# Patient Record
Sex: Male | Born: 1980
Health system: Southern US, Community
[De-identification: ages and names within clinical notes are randomized; demographics above are authoritative.]

## PROBLEM LIST (undated history)

## (undated) DIAGNOSIS — E785 Hyperlipidemia, unspecified: Secondary | ICD-10-CM

## (undated) DIAGNOSIS — M109 Gout, unspecified: Secondary | ICD-10-CM

## (undated) DIAGNOSIS — I10 Essential (primary) hypertension: Secondary | ICD-10-CM

## (undated) DIAGNOSIS — K219 Gastro-esophageal reflux disease without esophagitis: Secondary | ICD-10-CM

## (undated) DIAGNOSIS — G5622 Lesion of ulnar nerve, left upper limb: Secondary | ICD-10-CM

## (undated) HISTORY — DX: Lesion of ulnar nerve, left upper limb: G56.22

## (undated) HISTORY — DX: Morbid (severe) obesity due to excess calories: E66.01

## (undated) HISTORY — DX: Hyperlipidemia, unspecified: E78.5

---

## 2002-01-17 ENCOUNTER — Emergency Department (HOSPITAL_COMMUNITY): Admission: EM | Admit: 2002-01-17 | Discharge: 2002-01-17 | Payer: Self-pay | Admitting: Emergency Medicine

## 2002-01-17 ENCOUNTER — Encounter: Payer: Self-pay | Admitting: Emergency Medicine

## 2009-07-26 ENCOUNTER — Emergency Department (HOSPITAL_COMMUNITY): Admission: EM | Admit: 2009-07-26 | Discharge: 2009-07-26 | Payer: Self-pay | Admitting: Emergency Medicine

## 2009-07-28 ENCOUNTER — Encounter: Admission: RE | Admit: 2009-07-28 | Discharge: 2009-07-28 | Payer: Self-pay | Admitting: *Deleted

## 2010-11-07 IMAGING — US US ABDOMEN COMPLETE
1 series · 14 of 25 positions shown · non-contrast
Comparison: None

CLINICAL DATA: Epigastric pain and vomiting.

ABDOMINAL ULTRASOUND COMPLETE

[Series 1: us abdomen complete · 0.28mm/px · 14 of 72 slices shown]
[im 1/72]
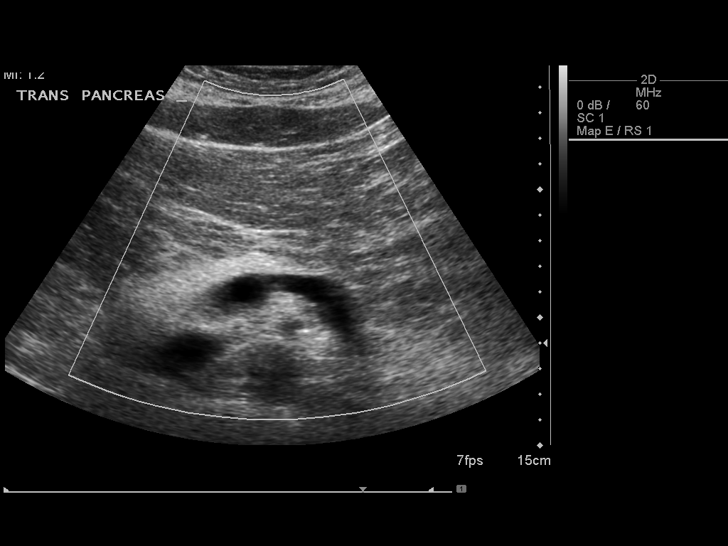
[im 6/72]
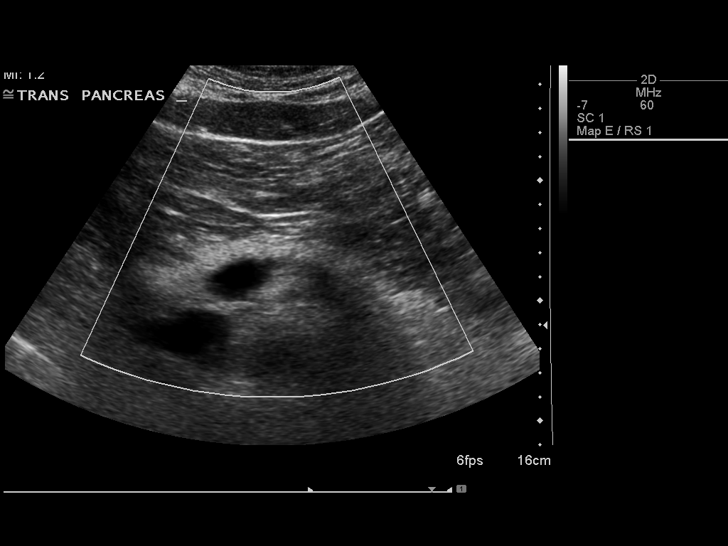
[im 12/72]
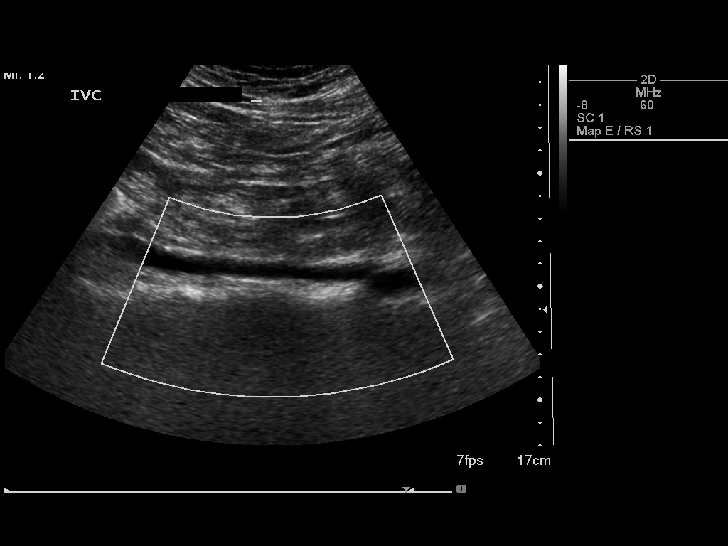
[im 18/72]
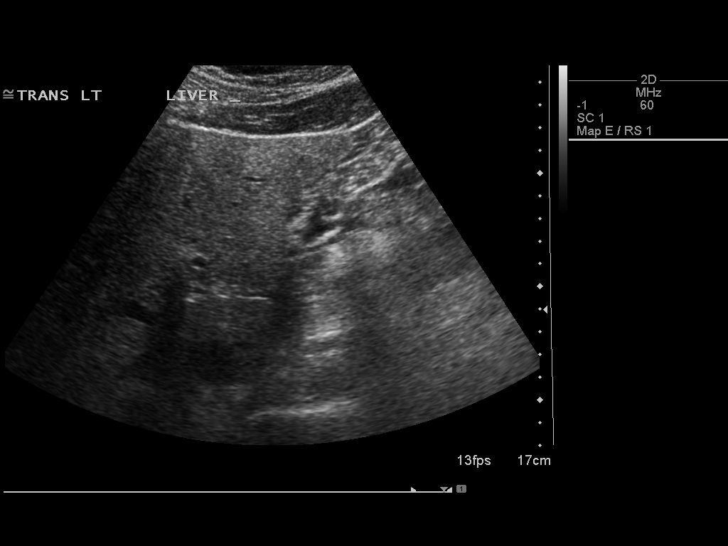
[im 24/72]
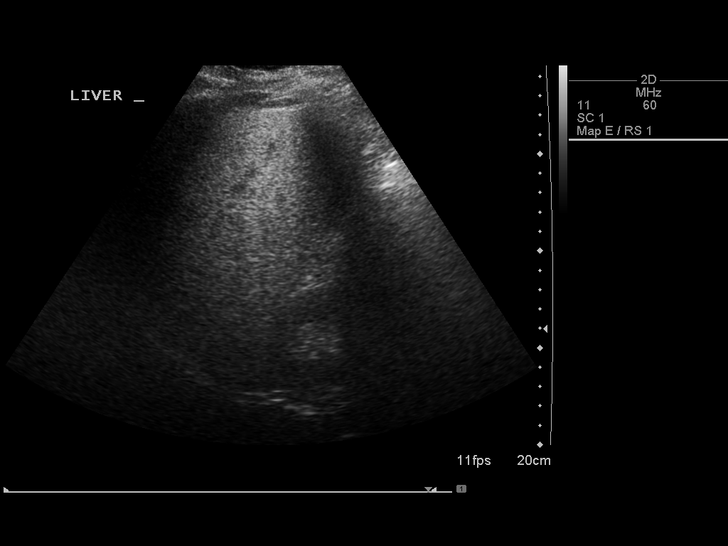
[im 27/72]
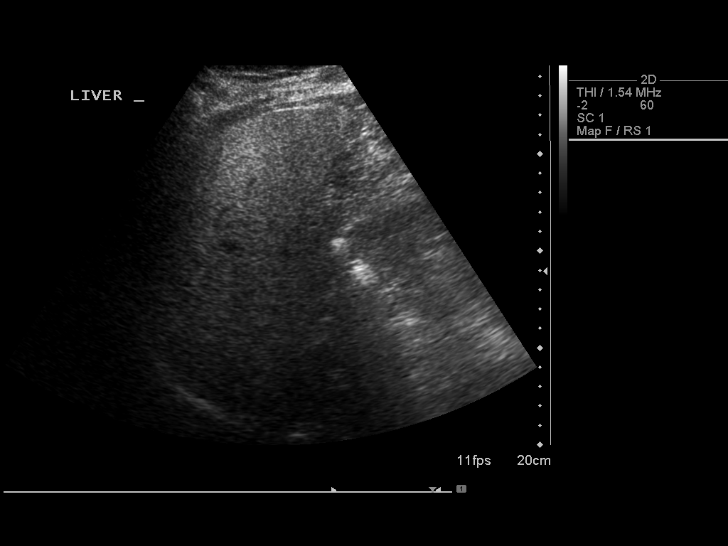
[im 33/72]
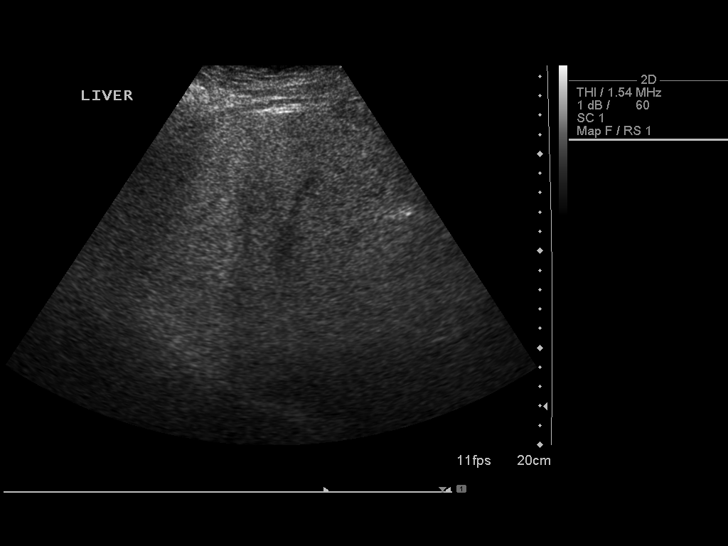
[im 39/72]
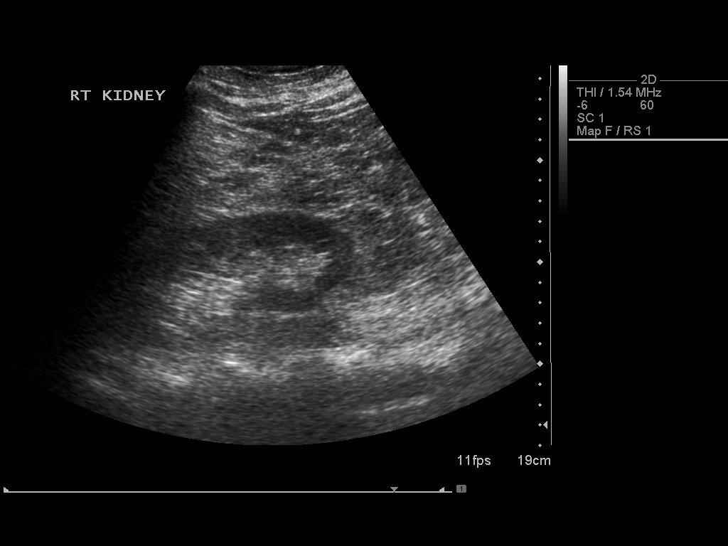
[im 45/72]
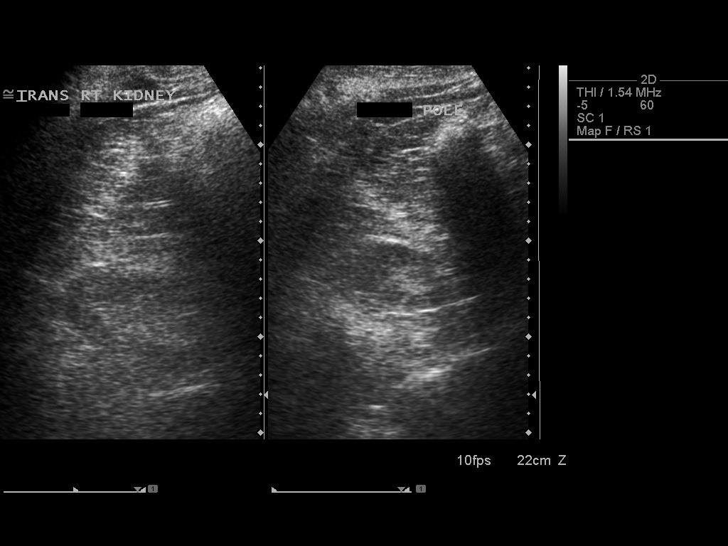
[im 48/72]
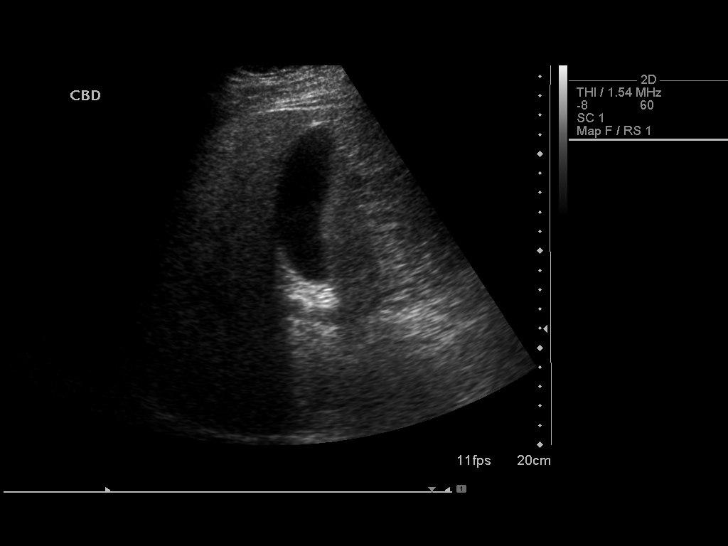
[im 54/72]
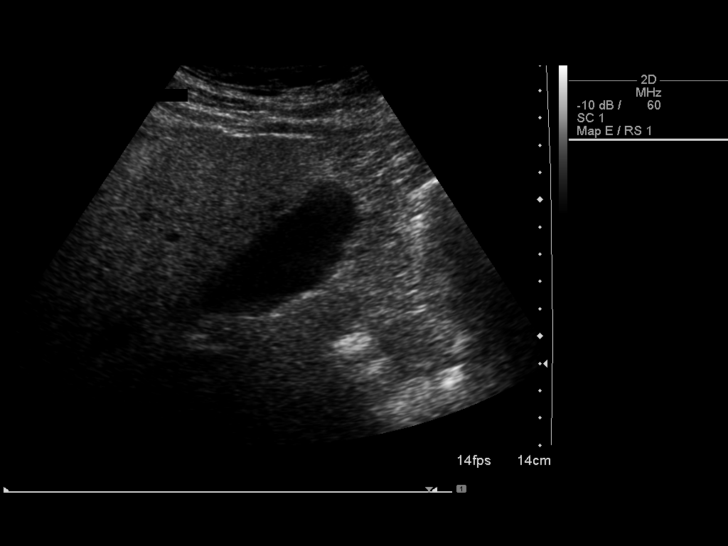
[im 60/72]
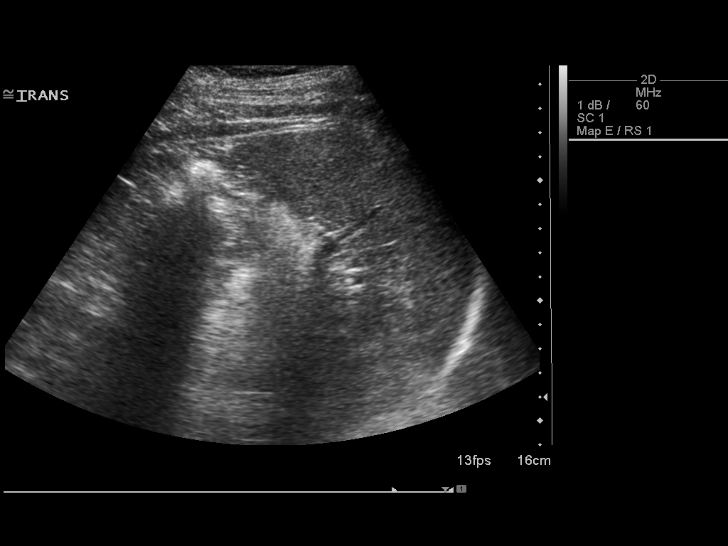
[im 66/72]
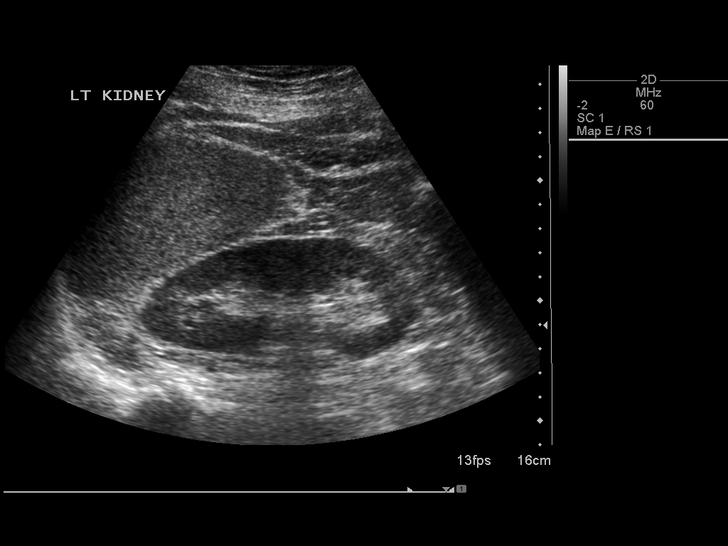
[im 72/72]
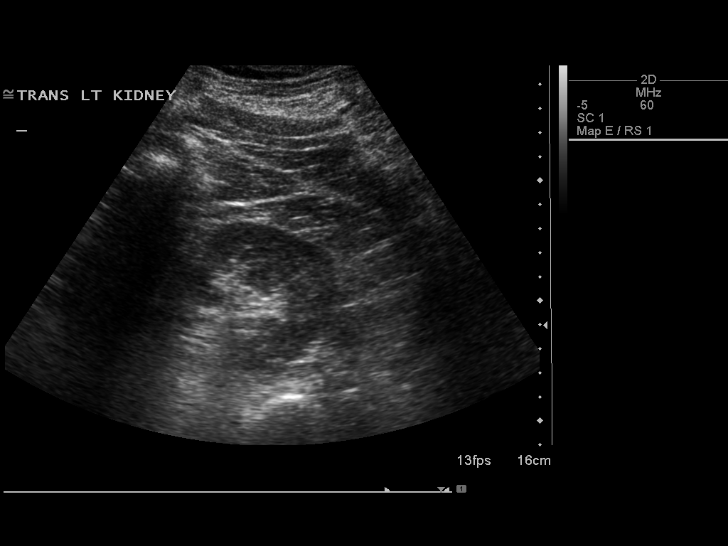

[14 of 25 positions shown; findings below may reference images not displayed]

FINDINGS: Gallbladder:  No gallstones, gallbladder wall thickening, or
pericholecystic fluid.

Common Bile Duct:  Within normal limits in caliber.

Liver:  No focal lesion identified.  Diffusely increased
parenchymal echogenicity, consistent with hepatic steatosis.

IVC:  Appears normal.

Pancreas:  Although the pancreas is difficult to visualize in its
entirety, no focal pancreatic abnormality is identified.

Spleen:  Within normal limits in size and echotexture.

Right kidney:  Normal in size and parenchymal echogenicity.  No
evidence of mass or hydronephrosis.

Left kidney:  Normal in size and parenchymal echogenicity.  No
evidence of mass or hydronephrosis.

Abdominal Aorta:  No aneurysm identified.
IMPRESSION: 1.  No evidence of gallstones or biliary dilatation.
2.  Hepatic steatosis.

## 2017-02-22 ENCOUNTER — Emergency Department (HOSPITAL_COMMUNITY): Payer: BLUE CROSS/BLUE SHIELD

## 2017-02-22 ENCOUNTER — Encounter (HOSPITAL_COMMUNITY): Payer: Self-pay

## 2017-02-22 DIAGNOSIS — R079 Chest pain, unspecified: Secondary | ICD-10-CM | POA: Diagnosis present

## 2017-02-22 DIAGNOSIS — R51 Headache: Secondary | ICD-10-CM | POA: Diagnosis not present

## 2017-02-22 DIAGNOSIS — Z87891 Personal history of nicotine dependence: Secondary | ICD-10-CM | POA: Insufficient documentation

## 2017-02-22 DIAGNOSIS — Z79899 Other long term (current) drug therapy: Secondary | ICD-10-CM | POA: Diagnosis not present

## 2017-02-22 DIAGNOSIS — I10 Essential (primary) hypertension: Secondary | ICD-10-CM | POA: Diagnosis not present

## 2017-02-22 DIAGNOSIS — R0789 Other chest pain: Secondary | ICD-10-CM | POA: Insufficient documentation

## 2017-02-22 LAB — BASIC METABOLIC PANEL
ANION GAP: 9 (ref 5–15)
BUN: 11 mg/dL (ref 6–20)
CALCIUM: 9.3 mg/dL (ref 8.9–10.3)
CO2: 25 mmol/L (ref 22–32)
Chloride: 103 mmol/L (ref 101–111)
Creatinine, Ser: 1.04 mg/dL (ref 0.61–1.24)
GFR calc Af Amer: >60 mL/min (ref >60)
GFR calc non Af Amer: >60 mL/min (ref >60)
GLUCOSE: 93 mg/dL (ref 65–99)
Potassium: 3.6 mmol/L (ref 3.5–5.1)
Sodium: 137 mmol/L (ref 135–145)

## 2017-02-22 LAB — CBC
HCT: 45.1 % (ref 39.0–52.0)
HEMOGLOBIN: 15.7 g/dL (ref 13.0–17.0)
MCH: 29.4 pg (ref 26.0–34.0)
MCHC: 34.8 g/dL (ref 30.0–36.0)
MCV: 84.5 fL (ref 78.0–100.0)
Platelets: 203 10*3/uL (ref 150–400)
RBC: 5.34 MIL/uL (ref 4.22–5.81)
RDW: 13 % (ref 11.5–15.5)
WBC: 8.7 10*3/uL (ref 4.0–10.5)

## 2017-02-22 LAB — I-STAT TROPONIN, ED: TROPONIN I, POC: 0 ng/mL (ref 0.00–0.08)

## 2017-02-22 NOTE — ED Triage Notes (Signed)
Pt complaining of central chest pressure x 2 days. Pt states started on lisinopril yesterday. Pt denies any SOB or cough. Pt a/o x 4, VSS, NAD.

## 2017-02-23 ENCOUNTER — Emergency Department (HOSPITAL_COMMUNITY)
Admission: EM | Admit: 2017-02-23 | Discharge: 2017-02-23 | Disposition: A | Discharge status: Home / Self Care | Payer: BLUE CROSS/BLUE SHIELD | Attending: Emergency Medicine | Admitting: Emergency Medicine

## 2017-02-23 ENCOUNTER — Emergency Department (HOSPITAL_COMMUNITY): Payer: BLUE CROSS/BLUE SHIELD

## 2017-02-23 DIAGNOSIS — R0789 Other chest pain: Secondary | ICD-10-CM

## 2017-02-23 DIAGNOSIS — R519 Headache, unspecified: Secondary | ICD-10-CM

## 2017-02-23 DIAGNOSIS — R51 Headache: Secondary | ICD-10-CM

## 2017-02-23 HISTORY — DX: Essential (primary) hypertension: I10

## 2017-02-23 LAB — I-STAT TROPONIN, ED: Troponin i, poc: 0 ng/mL (ref 0.00–0.08)

## 2017-02-23 NOTE — ED Provider Notes (Signed)
[facility]-EMERGENCY DEPT Provider Note   CSN: [id number] Arrival date & time: 02/22/17  2138 By signing my name below, I, Drue Flirt, attest that this documentation has been prepared under the direction and in the presence of Tomasita Crumble, MD . Electronically Signed: Drue Flirt Scribe. 02/23/2017. 2:28 AM  History   Chief Complaint Chief Complaint  Patient presents with  . Chest Pain   HPI Anthony Liu is a 36 y.o. male with a history of hypertension who presents to the Emergency Department complaining of sudden onset, intermittent, pressure-like chest pain onset two nights ago. Pt reports associated sudden-onset, waxing and waning, throbbing headache x2 days and intermittent sleep disturbance. No exacerbating or alleviating factors noted. Per pt, his symptoms began while having intercourse with his wife two nights ago. He was seen by his PCP yesterday for the same and dx with an orgasm related migraine. His headache and CP significantly improved yesterday, but he notes that he was unable to sleep last night. Pt reports that his chest pain returned tonight at about 8 pm while trying to fall asleep. He denies any pulmonary or cardiac hx. He denies any headache, chest tightness, diaphoresis, or n/v.   The history is provided by the patient. No language interpreter was used.   Past Medical History:  Diagnosis Date  . Hypertension    There are no active problems to display for this patient.  History reviewed. No pertinent surgical history.  Home Medications    Prior to Admission medications   Not on File    Family History History reviewed. No pertinent family history.  Social History Social History  Substance Use Topics  . Smoking status: Former Smoker    Quit date: 02/23/2015  . Smokeless tobacco: Current User    Types: Snuff  . Alcohol use Yes   Allergies   Patient has no allergy information on record.  Review of Systems Review of Systems 10 Systems reviewed and are  negative for acute change except as noted in the HPI.  Physical Exam Updated Vital Signs BP 139/85   Pulse 67   Temp 98.1 F (36.7 C) (Oral)   Resp 15   SpO2 98%   Physical Exam  Constitutional: He is oriented to person, place, and time. Vital signs are normal. He appears well-developed and well-nourished.  Non-toxic appearance. He does not appear ill. No distress.  HENT:  Head: Normocephalic and atraumatic.  Nose: Nose normal.  Mouth/Throat: Oropharynx is clear and moist. No oropharyngeal exudate.  Eyes: Conjunctivae and EOM are normal. Pupils are equal, round, and reactive to light. No scleral icterus.  Neck: Normal range of motion. Neck supple. No tracheal deviation, no edema, no erythema and normal range of motion present. No thyroid mass and no thyromegaly present.  Cardiovascular: Normal rate, regular rhythm, S1 normal, S2 normal, normal heart sounds, intact distal pulses and normal pulses.  Exam reveals no gallop and no friction rub.   No murmur heard. Pulmonary/Chest: Effort normal and breath sounds normal. No respiratory distress. He has no wheezes. He has no rhonchi. He has no rales.  Abdominal: Soft. Normal appearance and bowel sounds are normal. He exhibits no distension, no ascites and no mass. There is no hepatosplenomegaly. There is no tenderness. There is no rebound, no guarding and no CVA tenderness.  Musculoskeletal: Normal range of motion. He exhibits no edema or tenderness.  Lymphadenopathy:    He has no cervical adenopathy.  Neurological: He is alert and oriented to person, place, and time.  He has normal strength. No cranial nerve deficit or sensory deficit.  Skin: Skin is warm, dry and intact. No petechiae and no rash noted. He is not diaphoretic. No erythema. No pallor.  Nursing note and vitals reviewed.  ED Treatments / Results    DIAGNOSTIC STUDIES:  Oxygen Saturation is 98% on RA, normal by my interpretation.    COORDINATION OF CARE:  1:43 AM Will order  CT head. Discussed treatment plan with pt at bedside and pt agreed to plan.     Labs (all labs ordered are listed, but only abnormal results are displayed) Labs Reviewed  BASIC METABOLIC PANEL  CBC  I-STAT TROPOININ, ED  I-STAT TROPOININ, ED    EKG  EKG Interpretation  Date/Time:  Saturday February 23 2017 02:03:05 EDT Ventricular Rate:  64 PR Interval:  146 QRS Duration: 109 QT Interval:  417 QTC Calculation: 431 R Axis:   50 Text Interpretation:  Sinus rhythm No significant change since last tracing Confirmed by Erroll Luna [phone]) on 02/23/2017 2:06:02 AM      Radiology Dg Chest 2 View  Result Date: 02/22/2017 CLINICAL DATA:  Initial evaluation for acute chest pressure for 2 days. EXAM: CHEST  2 VIEW COMPARISON:  None available. FINDINGS: The cardiac and mediastinal silhouettes are within normal limits. The lungs are normally inflated. No airspace consolidation, pleural effusion, or pulmonary edema is identified. There is no pneumothorax. No acute osseous abnormality identified. IMPRESSION: No active cardiopulmonary disease. Electronically Signed   By: Rise Mu M.D.   On: 02/22/2017 22:29   Ct Head Wo Contrast  Result Date: 02/23/2017 CLINICAL DATA:  Headache, chest pressure. Started lisinopril yesterday. EXAM: CT HEAD WITHOUT CONTRAST TECHNIQUE: Contiguous axial images were obtained from the base of the skull through the vertex without intravenous contrast. COMPARISON:  None. FINDINGS: BRAIN: No intraparenchymal hemorrhage, mass effect nor midline shift. The ventricles and sulci are normal. No acute large vascular territory infarcts. No abnormal extra-axial fluid collections. Basal cisterns are patent. VASCULAR: Unremarkable. SKULL/SOFT TISSUES: No skull fracture. No significant soft tissue swelling. ORBITS/SINUSES: The included ocular globes and orbital contents are normal.The mastoid aircells and included paranasal sinuses are well-aerated. OTHER: None.  IMPRESSION: Normal noncontrast CT HEAD. Electronically Signed   By: Awilda Metro M.D.   On: 02/23/2017 03:04    Procedures Procedures (including critical care time)  Medications Ordered in ED Medications - No data to display  Initial Impression / Assessment and Plan / ED Course  I have reviewed the triage vital signs and the nursing notes.  Pertinent labs & imaging results that were available during my care of the patient were reviewed by me and considered in my medical decision making (see chart for details).    Patient presents to the ED for headache during intercourse and intermittent chest pain.  History is not typical for ACS, HEART score is 1 for risk factors.  Repeat troponin and EKG at 3 hours are negative and unchanged. CT head normal as well.  Advised on tylenol or ibuprofen as needed for pain and PCP fu within 3 days. HE appears well and in NAD.  He demonstrates god understanding to the plan.  VS remain within his normal limits and he is safe for DC.  Final Clinical Impressions(s) / ED Diagnoses   Final diagnoses:  None    New Prescriptions New Prescriptions   No medications on file   I personally performed the services described in this documentation, which was scribed in my presence. The  recorded information has been reviewed and is accurate.       Tomasita Crumble, MD 02/23/17 [phone]

## 2017-02-23 NOTE — ED Notes (Signed)
Patient transported to CT 

## 2018-04-10 ENCOUNTER — Other Ambulatory Visit: Payer: Self-pay

## 2018-04-10 ENCOUNTER — Emergency Department (HOSPITAL_COMMUNITY): Payer: BLUE CROSS/BLUE SHIELD

## 2018-04-10 ENCOUNTER — Emergency Department (HOSPITAL_COMMUNITY)
Admission: EM | Admit: 2018-04-10 | Discharge: 2018-04-10 | Disposition: A | Discharge status: Home / Self Care | Payer: BLUE CROSS/BLUE SHIELD | Attending: Emergency Medicine | Admitting: Emergency Medicine

## 2018-04-10 ENCOUNTER — Encounter (HOSPITAL_COMMUNITY): Payer: Self-pay

## 2018-04-10 DIAGNOSIS — I249 Acute ischemic heart disease, unspecified: Secondary | ICD-10-CM | POA: Diagnosis not present

## 2018-04-10 DIAGNOSIS — Z87891 Personal history of nicotine dependence: Secondary | ICD-10-CM | POA: Insufficient documentation

## 2018-04-10 DIAGNOSIS — I10 Essential (primary) hypertension: Secondary | ICD-10-CM | POA: Diagnosis not present

## 2018-04-10 DIAGNOSIS — G5622 Lesion of ulnar nerve, left upper limb: Secondary | ICD-10-CM | POA: Insufficient documentation

## 2018-04-10 DIAGNOSIS — R079 Chest pain, unspecified: Secondary | ICD-10-CM | POA: Diagnosis not present

## 2018-04-10 DIAGNOSIS — R05 Cough: Secondary | ICD-10-CM | POA: Diagnosis not present

## 2018-04-10 HISTORY — DX: Gastro-esophageal reflux disease without esophagitis: K21.9

## 2018-04-10 HISTORY — DX: Gout, unspecified: M10.9

## 2018-04-10 LAB — BASIC METABOLIC PANEL
Anion gap: 9 (ref 5–15)
BUN: 12 mg/dL (ref 6–20)
CALCIUM: 9.2 mg/dL (ref 8.9–10.3)
CO2: 27 mmol/L (ref 22–32)
Chloride: 103 mmol/L (ref 101–111)
Creatinine, Ser: 0.99 mg/dL (ref 0.61–1.24)
GFR calc Af Amer: >60 mL/min (ref >60)
GLUCOSE: 119 mg/dL — AB (ref 65–99)
POTASSIUM: 4.2 mmol/L (ref 3.5–5.1)
Sodium: 139 mmol/L (ref 135–145)

## 2018-04-10 LAB — I-STAT TROPONIN, ED
Troponin i, poc: 0 ng/mL (ref 0.00–0.08)
Troponin i, poc: 0.01 ng/mL (ref 0.00–0.08)

## 2018-04-10 LAB — CBC
HEMATOCRIT: 46.3 % (ref 39.0–52.0)
HEMOGLOBIN: 15.4 g/dL (ref 13.0–17.0)
MCH: 28.5 pg (ref 26.0–34.0)
MCHC: 33.3 g/dL (ref 30.0–36.0)
MCV: 85.7 fL (ref 78.0–100.0)
Platelets: 223 10*3/uL (ref 150–400)
RBC: 5.4 MIL/uL (ref 4.22–5.81)
RDW: 12.6 % (ref 11.5–15.5)
WBC: 8.3 10*3/uL (ref 4.0–10.5)

## 2018-04-10 MED ORDER — MELOXICAM 15 MG PO TABS: 15.0000 mg | ORAL_TABLET | Freq: Every day | ORAL | 0 refills | Status: AC

## 2018-04-10 NOTE — ED Notes (Signed)
ED Provider at bedside. 

## 2018-04-10 NOTE — ED Provider Notes (Signed)
[facility] Provider Note   CSN: [id number] Arrival date & time: 04/10/18  [id number]     History   Chief Complaint Chief Complaint  Patient presents with  . Chest Pain  Chief complaint: Left arm from elbow down to fingertips began hurting yesterday, followed by chest pain that began around 11:30 PM last night.  HPI Anthony Liu is a 37 y.o. male with a history of uncontrolled hypertension and GERD presenting primarily for chest pain that began at 11 PM last night.  Patient describes chest pain as a constant pressure in the center of his chest radiating to his back between his shoulder blades. Patient took to 200 mg ibuprofen last night for the pain however the pain has remained unchanged.  Patient states pain remains at 2/10.  Patient states pain is not worsened with movement or exertion.  Patient was seen here last year in April for similar chest pain however he is concerned today because he did not have the arm tingling last year when he was seen.  Associated symptoms: Left arm pain and tingling that started just above his left elbow and radiates down to his left pinky and ring finger.  Patient states that pain began yesterday afternoon while driving home from work then states pain changed last night to a tingling feeling around the same time that his chest pain began.  Patient denies weakness in the extremity. Patient also states he has had 3 bouts of watery diarrhea in the last 24 hours, last bowel movement this morning.  Denies blood in stool, abdominal pain, fever, nausea or vomiting.   Past Medical History:  Diagnosis Date  . GERD (gastroesophageal reflux disease)   . Gout   . Hypertension     There are no active problems to display for this patient.   History reviewed. No pertinent surgical history.      Home Medications    Prior to Admission medications   Medication Sig Start Date End Date Taking? Authorizing Provider  meloxicam  (MOBIC) 15 MG tablet Take 1 tablet (15 mg total) by mouth daily for 7 days. 04/10/18 04/17/18  Bill SalinasMorelli, Grayton Lobo A, PA-C    Family History No family history on file.  Social History Social History   Tobacco Use  . Smoking status: Former Smoker    Last attempt to quit: 02/23/2015    Years since quitting: 3.1  . Smokeless tobacco: Current User    Types: Snuff  Substance Use Topics  . Alcohol use: Yes  . Drug use: Never     Allergies   Patient has no known allergies.   Review of Systems Review of Systems  Constitutional: Negative.  Negative for chills, diaphoresis and fever.  HENT: Positive for congestion and sore throat. Negative for ear pain, hearing loss, rhinorrhea, trouble swallowing and voice change.   Eyes: Negative.   Respiratory: Positive for cough. Negative for shortness of breath, wheezing and stridor.   Cardiovascular: Positive for chest pain. Negative for leg swelling.  Gastrointestinal: Positive for diarrhea. Negative for abdominal pain, blood in stool, constipation, nausea and vomiting.  Endocrine: Negative.   Genitourinary: Negative.  Negative for dysuria, hematuria and testicular pain.  Musculoskeletal: Positive for arthralgias. Negative for back pain and myalgias.  Skin: Negative.  Negative for rash and wound.  Allergic/Immunologic: Positive for environmental allergies.  Neurological: Positive for numbness. Negative for dizziness, syncope, speech difficulty, weakness, light-headedness and headaches.  Hematological: Negative.   Psychiatric/Behavioral: Negative.  Physical Exam Updated Vital Signs BP 125/82   Pulse 63   Temp 98.1 F (36.7 C) (Oral)   Resp 18   Ht 5\' 10"  (1.778 m)   Wt 117.9 kg (260 lb)   SpO2 96%   BMI 37.31 kg/m   Physical Exam  Constitutional: He is oriented to person, place, and time. He appears well-developed and well-nourished.  Non-toxic appearance. He does not appear ill. No distress.  HENT:  Head: Normocephalic and  atraumatic.  Eyes: EOM are normal.  Neck: Normal range of motion.  Cardiovascular: Normal rate, regular rhythm, intact distal pulses and normal pulses. Exam reveals no gallop and no distant heart sounds.  No murmur heard.  No systolic murmur is present.  No diastolic murmur is present. Chest pain is worsened with palpation of the sternum.  Pulmonary/Chest: Effort normal and breath sounds normal. No accessory muscle usage or stridor. No tachypnea. No respiratory distress. He has no decreased breath sounds. He has no wheezes. He has no rhonchi. He has no rales. He exhibits tenderness. He exhibits no mass, no laceration, no crepitus, no edema, no deformity, no swelling and no retraction.    Abdominal: Soft. Bowel sounds are normal. He exhibits no distension and no mass. There is no tenderness. There is no rebound and no guarding.  Musculoskeletal: Normal range of motion.       Left shoulder: Normal. He exhibits normal range of motion and no deformity.       Left elbow: Normal. He exhibits normal range of motion, no swelling, no effusion, no deformity and no laceration. No tenderness found.       Left wrist: Normal. He exhibits normal range of motion, no tenderness, no bony tenderness, no swelling, no effusion, no crepitus, no deformity and no laceration.       Right forearm: Normal. He exhibits no tenderness, no bony tenderness, no swelling, no edema, no deformity and no laceration.       Right hand: Normal. He exhibits normal range of motion, no tenderness, no bony tenderness, normal capillary refill, no deformity and no swelling. Normal sensation noted. Normal strength noted.       Right lower leg: Normal.       Left lower leg: Normal.  Neurological: He is alert and oriented to person, place, and time.  Skin: Skin is warm and dry. Capillary refill takes less than 2 seconds. No abrasion, no ecchymosis and no rash noted.  Psychiatric: He has a normal mood and affect. His behavior is normal.      ED Treatments / Results  Labs (all labs ordered are listed, but only abnormal results are displayed) Labs Reviewed  BASIC METABOLIC PANEL - Abnormal; Notable for the following components:      Result Value   Glucose, Bld 119 (*)    All other components within normal limits  CBC  I-STAT TROPONIN, ED  I-STAT TROPONIN, ED    EKG EKG Interpretation  Date/Time:  Thursday April 10 2018 05:15:44 EDT Ventricular Rate:  76 PR Interval:  144 QRS Duration: 94 QT Interval:  402 QTC Calculation: 452 R Axis:   42 Text Interpretation:  Normal sinus rhythm Normal ECG Confirmed by Zadie Rhine ([id number]) on 04/10/2018 6:26:14 AM Also confirmed by Zadie Rhine ([id number]), editor Elita Quick [phone])  on 04/10/2018 7:05:33 AM   Radiology Dg Chest 2 View  Result Date: 04/10/2018 CLINICAL DATA:  Chest pain.  Left arm tingling. EXAM: CHEST - 2 VIEW COMPARISON:  02/22/2017 FINDINGS: The cardiomediastinal  contours are normal. The lungs are clear. Pulmonary vasculature is normal. No consolidation, pleural effusion, or pneumothorax. No acute osseous abnormalities are seen. IMPRESSION: No acute pulmonary process. Electronically Signed   By: Rubye Oaks M.D.   On: 04/10/2018 05:48    Procedures Procedures (including critical care time)  Medications Ordered in ED Medications - No data to display   Initial Impression / Assessment and Plan / ED Course  I have reviewed the triage vital signs and the nursing notes.  Pertinent labs & imaging results that were available during my care of the patient were reviewed by me and considered in my medical decision making (see chart for details).    Patient is to be discharged with recommendation to follow up with PCP in regards to today's hospital visit. Chest pain is not likely of cardiac or pulmonary etiology d/t presentation, perc negative, VSS, no tracheal deviation, no JVD or new murmur, RRR, breath sounds equal bilaterally, EKG without  acute abnormalities, negative troponin x2, and negative CXR.  Pain is also reproducible by pushing on the sternum.  Pt has been advised to return to the ED is CP becomes exertional, associated with diaphoresis or nausea, radiates to left jaw/arm, worsens or becomes concerning in any way. Pt appears reliable for follow up and is agreeable to discharge.  Heart score of 2.  Patient encouraged to follow-up with primary care provider, given referral to community health and wellness and encouraged to follow-up.  Left arm pain and tingling likely unrelated to chest pain, tingling and pain is reproducible by pressing on the medial condyle of the left arm.  Likely ulnar neuropathy/cubital tunnel syndrome due to the patient's work.  Patient will be given follow-up with hand specialist.  Patient will be given prescription for NSAIDs for pain.  Patient given extensive return precautions, patient states understanding of return precautions.  Patient given return precautions in AVS.  Case has been discussed with and seen by Arthor Captain PA-C who agrees with the above plan to discharge.    Final Clinical Impressions(s) / ED Diagnoses   Final diagnoses:  Chest pain with low risk of acute coronary syndrome  Ulnar neuropathy at elbow of left upper extremity    ED Discharge Orders        Ordered    meloxicam (MOBIC) 15 MG tablet  Daily     04/10/18 0953       Bill Salinas, PA-C 04/10/18 1055    Tilden Fossa, MD 04/10/18 [phone]

## 2018-04-10 NOTE — Discharge Instructions (Addendum)
Get help right away if: °Your chest pain is worse. °You have a cough that gets worse, or you cough up blood. °You have severe pain in your abdomen. °You have severe weakness. °You faint. °You have sudden, unexplained chest discomfort. °You have sudden, unexplained discomfort in your arms, back, neck, or jaw. °You have shortness of breath at any time. °You suddenly start to sweat, or your skin gets clammy. °You feel nauseous or you vomit. °You suddenly feel light-headed or dizzy. °Your heart begins to beat quickly, or it feels like it is skipping beats. °

## 2018-04-10 NOTE — ED Triage Notes (Signed)
Pt states that last night he was having pain and tingling in his L arm, that resolved and came back, around midnight central CP started , radiates to back, denies SOB, n/v.

## 2018-06-04 IMAGING — DX DG CHEST 2V
2 series · 2 of 2 positions shown · non-contrast
Comparison: None available.

CLINICAL DATA: Initial evaluation for acute chest pressure for 2
days.

EXAM:
CHEST  2 VIEW

[chest pa]
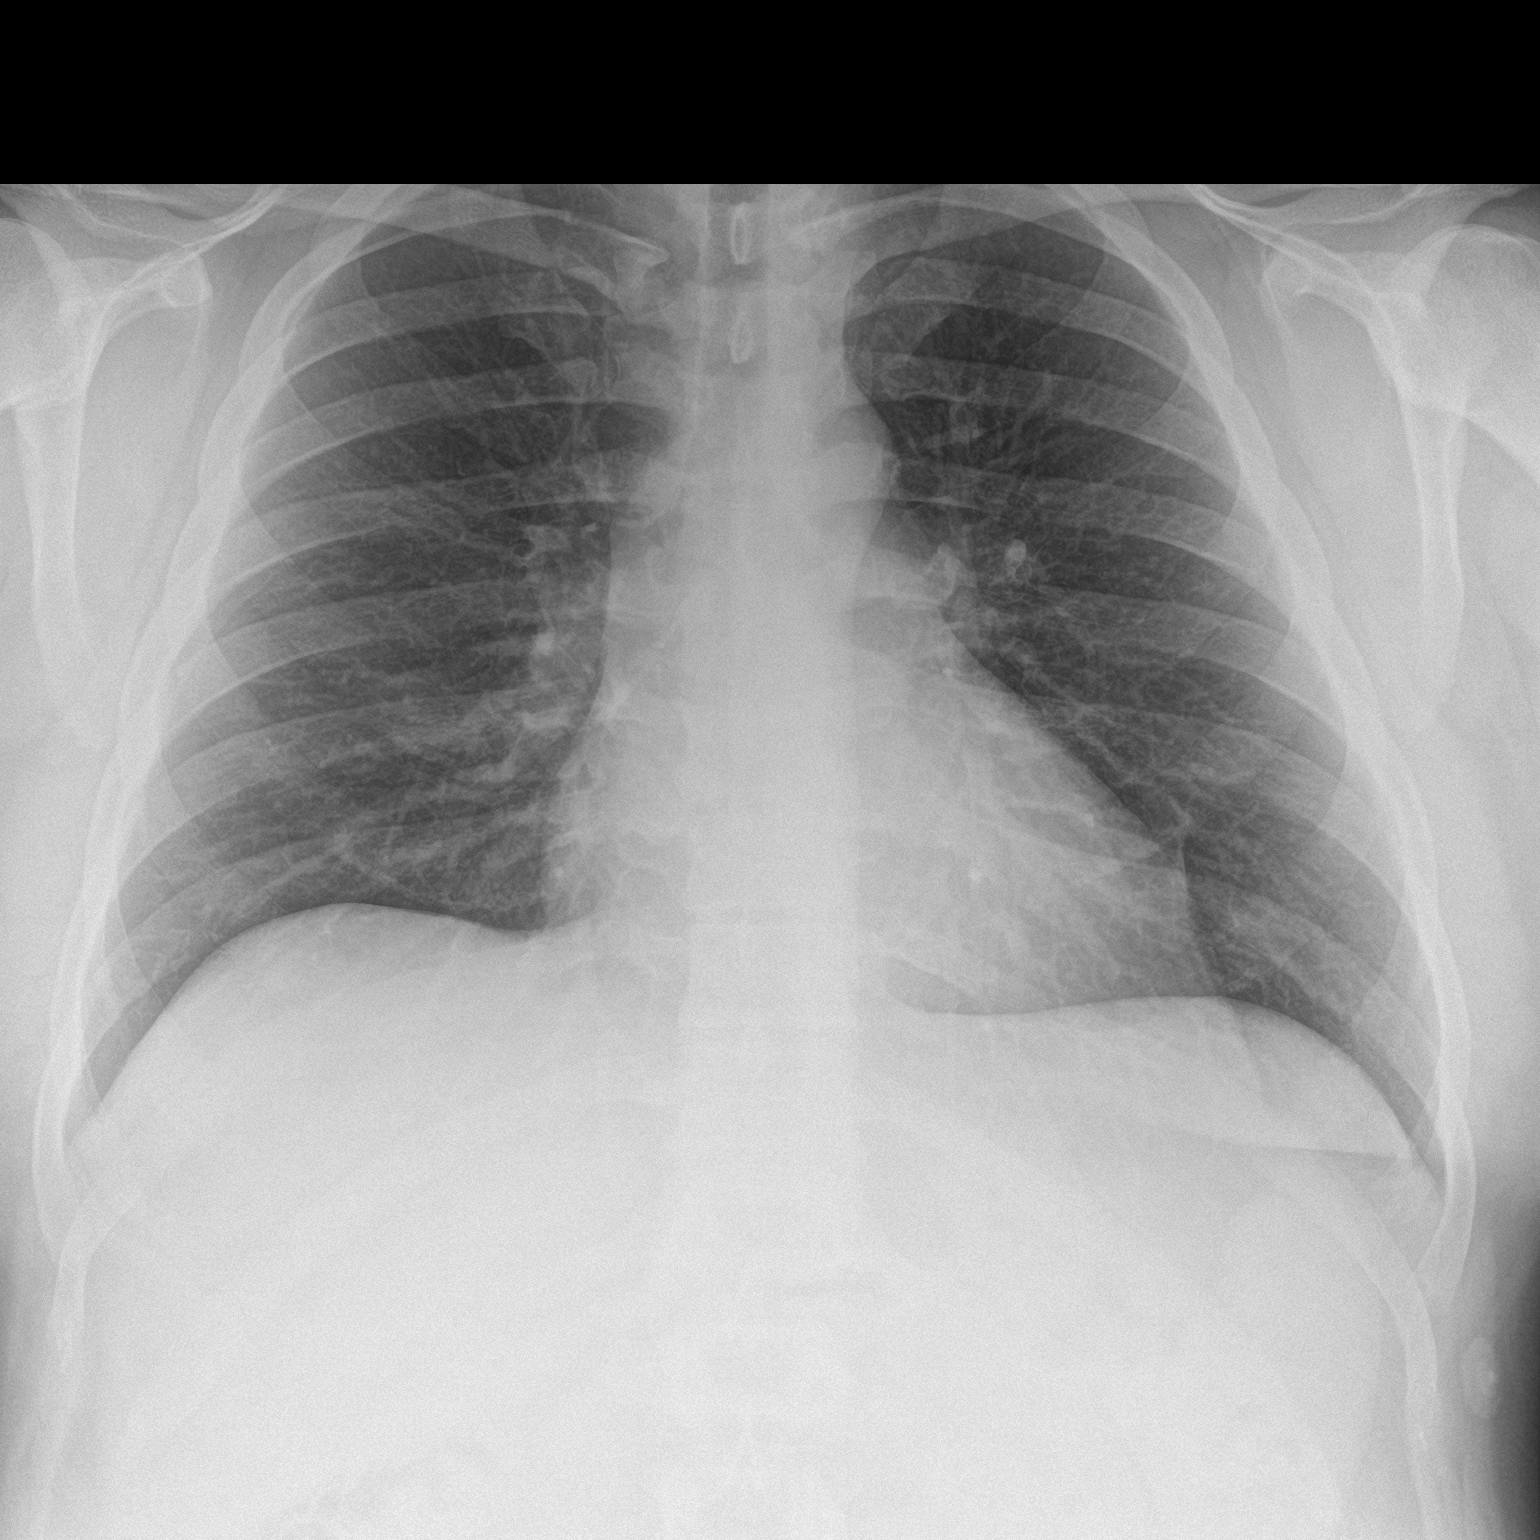

[chest lat]
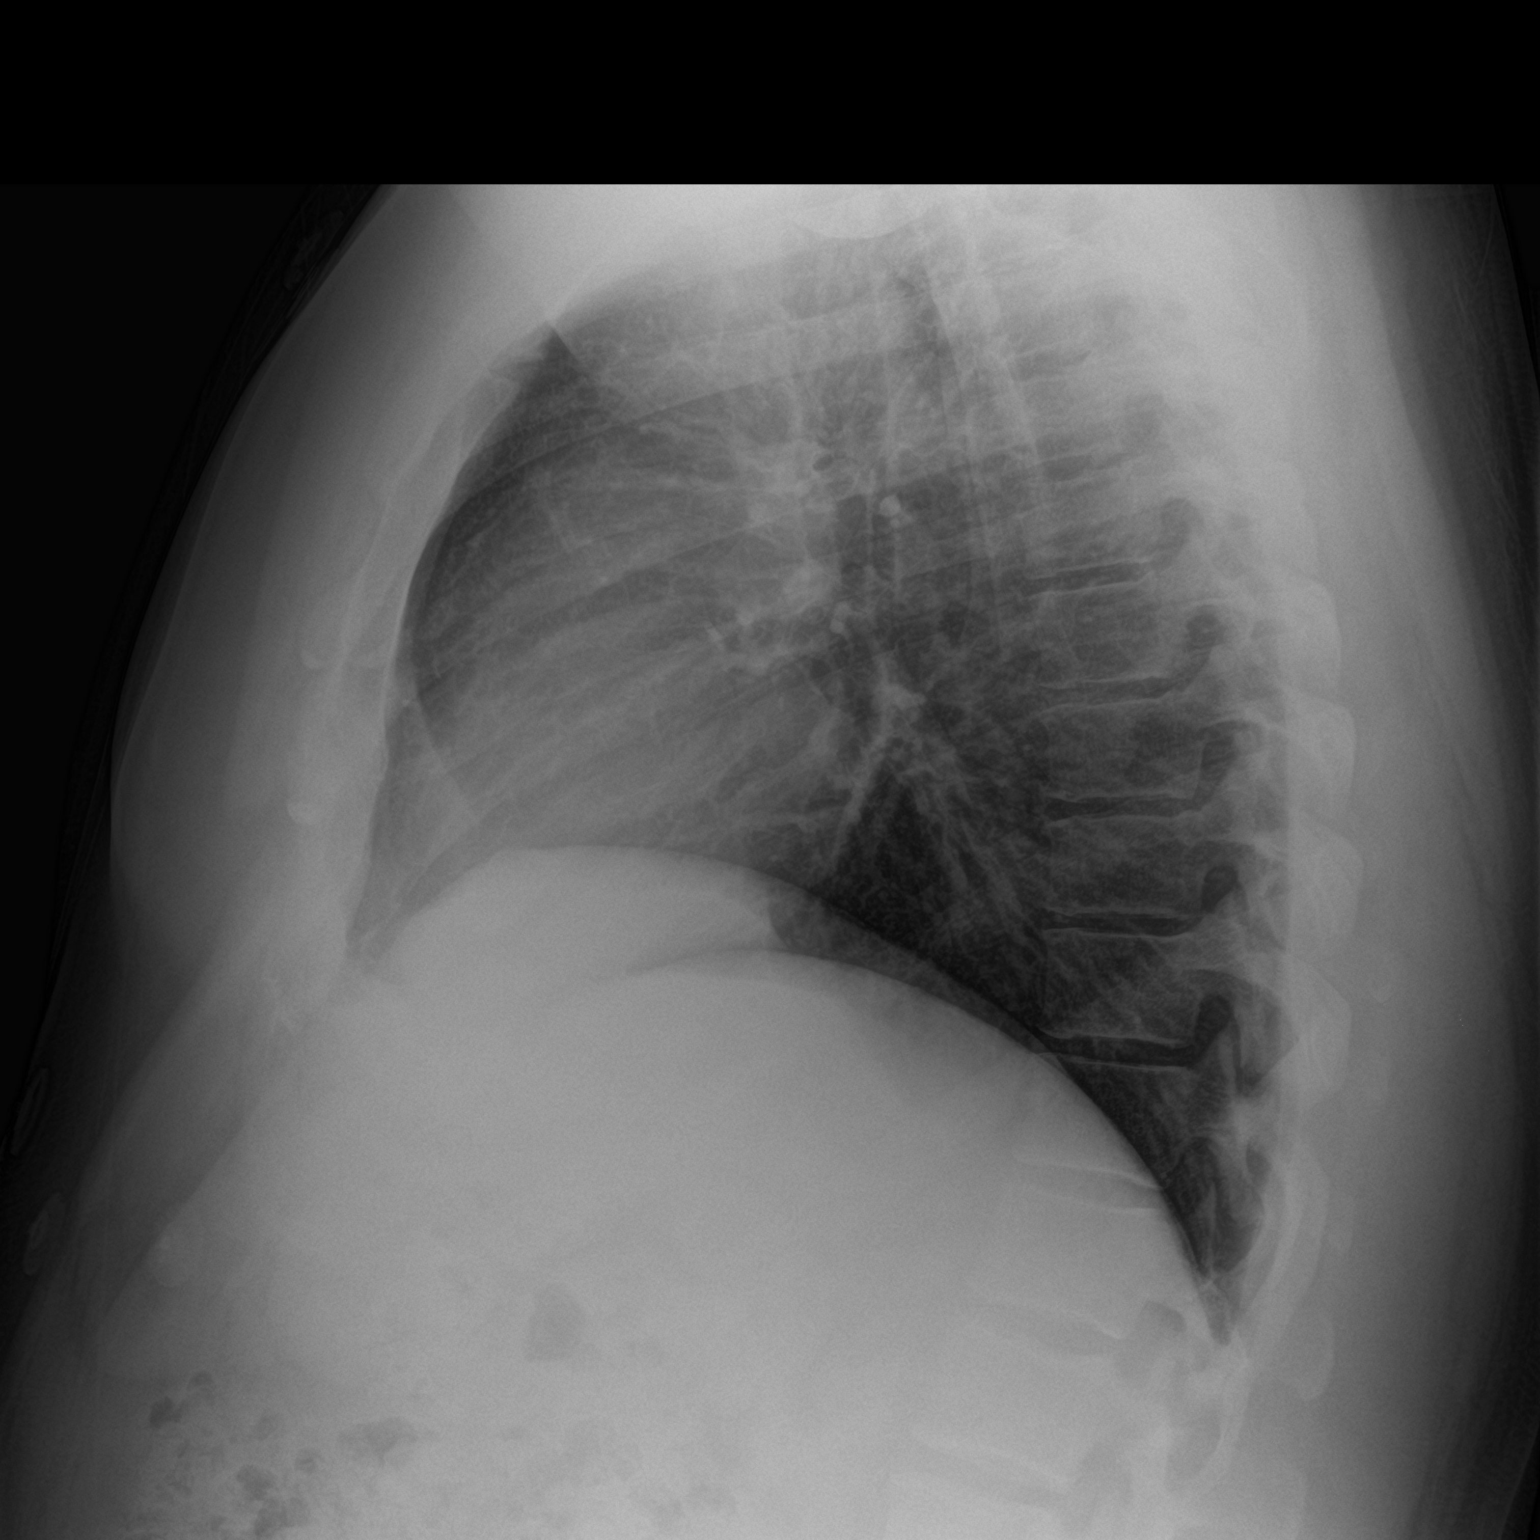

[2 of 2 positions shown; findings below may reference images not displayed]

FINDINGS: The cardiac and mediastinal silhouettes are within normal limits.

The lungs are normally inflated. No airspace consolidation, pleural
effusion, or pulmonary edema is identified. There is no
pneumothorax.

No acute osseous abnormality identified.
IMPRESSION: No active cardiopulmonary disease.

## 2018-12-08 ENCOUNTER — Encounter (HOSPITAL_COMMUNITY): Payer: Self-pay | Admitting: Emergency Medicine

## 2018-12-08 ENCOUNTER — Other Ambulatory Visit: Payer: Self-pay

## 2018-12-08 ENCOUNTER — Emergency Department (HOSPITAL_COMMUNITY)
Admission: EM | Admit: 2018-12-08 | Discharge: 2018-12-09 | Disposition: A | Discharge status: Home / Self Care | Payer: BLUE CROSS/BLUE SHIELD | Attending: Emergency Medicine | Admitting: Emergency Medicine

## 2018-12-08 ENCOUNTER — Emergency Department (HOSPITAL_COMMUNITY): Payer: BLUE CROSS/BLUE SHIELD

## 2018-12-08 DIAGNOSIS — R0789 Other chest pain: Secondary | ICD-10-CM | POA: Insufficient documentation

## 2018-12-08 DIAGNOSIS — Z87891 Personal history of nicotine dependence: Secondary | ICD-10-CM | POA: Insufficient documentation

## 2018-12-08 DIAGNOSIS — R079 Chest pain, unspecified: Secondary | ICD-10-CM | POA: Diagnosis not present

## 2018-12-08 DIAGNOSIS — I10 Essential (primary) hypertension: Secondary | ICD-10-CM | POA: Diagnosis not present

## 2018-12-08 LAB — I-STAT TROPONIN, ED: TROPONIN I, POC: 0 ng/mL (ref 0.00–0.08)

## 2018-12-08 MED ORDER — SODIUM CHLORIDE 0.9% FLUSH: 3.0000 mL | Freq: Once | INTRAVENOUS | Status: DC

## 2018-12-08 NOTE — ED Triage Notes (Signed)
Pt c/o central chest pain that radiates between the shoulder blades x 1 day. Hx HTN, has been out of lisinopril.

## 2018-12-09 LAB — BASIC METABOLIC PANEL
Anion gap: 9 (ref 5–15)
BUN: 13 mg/dL (ref 6–20)
CHLORIDE: 106 mmol/L (ref 98–111)
CO2: 24 mmol/L (ref 22–32)
Calcium: 8.8 mg/dL — ABNORMAL LOW (ref 8.9–10.3)
Creatinine, Ser: 0.89 mg/dL (ref 0.61–1.24)
GFR calc Af Amer: >60 mL/min (ref >60)
GFR calc non Af Amer: >60 mL/min (ref >60)
Glucose, Bld: 103 mg/dL — ABNORMAL HIGH (ref 70–99)
Potassium: 3.8 mmol/L (ref 3.5–5.1)
Sodium: 139 mmol/L (ref 135–145)

## 2018-12-09 LAB — CBC
HCT: 46.3 % (ref 39.0–52.0)
Hemoglobin: 15.3 g/dL (ref 13.0–17.0)
MCH: 28.2 pg (ref 26.0–34.0)
MCHC: 33 g/dL (ref 30.0–36.0)
MCV: 85.3 fL (ref 80.0–100.0)
NRBC: 0 % (ref 0.0–0.2)
Platelets: 206 10*3/uL (ref 150–400)
RBC: 5.43 MIL/uL (ref 4.22–5.81)
RDW: 13.2 % (ref 11.5–15.5)
WBC: 8 10*3/uL (ref 4.0–10.5)

## 2018-12-09 NOTE — ED Notes (Signed)
Pt returned from outside

## 2018-12-09 NOTE — ED Provider Notes (Signed)
TIME SEEN: 5:52 AM  CHIEF COMPLAINT: Chest pain  HPI: Patient is a 38 year old male with history of obesity, hypertension no longer on medications who presents to the emergency department with chest pain.  States he has had a right-sided dull chest pain for the past 2 to 3 weeks that goes in between his shoulder blades.  No aggravating or alleviating factors.  States pain "comes and goes as it wants".  States he had the pain at night and checked his blood pressure and it was in the 140s/100s and this concerned him so they came to the ED.  Chest pain-free currently.  No shortness of breath, nausea, vomiting, diaphoresis, dizziness.  No history of PE, DVT, exogenous estrogen use, recent fractures, surgery, trauma, hospitalization or prolonged travel. No lower extremity swelling or pain. No calf tenderness.  Does have family history of CAD.  He has never had a stress test or cardiac catheterization.  ROS: See HPI Constitutional: no fever  Eyes: no drainage  ENT: no runny nose   Cardiovascular:   chest pain  Resp: no SOB  GI: no vomiting GU: no dysuria Integumentary: no rash  Allergy: no hives  Musculoskeletal: no leg swelling  Neurological: no slurred speech ROS otherwise negative  PAST MEDICAL HISTORY/PAST SURGICAL HISTORY:  Past Medical History:  Diagnosis Date  . GERD (gastroesophageal reflux disease)   . Gout   . Hypertension     MEDICATIONS:  Prior to Admission medications   Not on File    ALLERGIES:  No Known Allergies  SOCIAL HISTORY:  Social History   Tobacco Use  . Smoking status: Former Smoker    Last attempt to quit: 02/23/2015    Years since quitting: 3.7  . Smokeless tobacco: Current User    Types: Snuff  Substance Use Topics  . Alcohol use: Yes    FAMILY HISTORY: No family history on file.  EXAM: BP (!) 145/93 (BP Location: Right Arm)   Pulse 69   Temp 97.9 F (36.6 C) (Oral)   Resp 17   SpO2 98%  CONSTITUTIONAL: Alert and oriented and responds  appropriately to questions. Well-appearing; well-nourished HEAD: Normocephalic EYES: Conjunctivae clear, pupils appear equal, EOMI ENT: normal nose; moist mucous membranes NECK: Supple, no meningismus, no nuchal rigidity, no LAD  CARD: RRR; S1 and S2 appreciated; no murmurs, no clicks, no rubs, no gallops RESP: Normal chest excursion without splinting or tachypnea; breath sounds clear and equal bilaterally; no wheezes, no rhonchi, no rales, no hypoxia or respiratory distress, speaking full sentences ABD/GI: Normal bowel sounds; non-distended; soft, non-tender, no rebound, no guarding, no peritoneal signs, no hepatosplenomegaly BACK:  The back appears normal and is non-tender to palpation, there is no CVA tenderness EXT: Normal ROM in all joints; non-tender to palpation; no edema; normal capillary refill; no cyanosis, no calf tenderness or swelling    SKIN: Normal color for age and race; warm; no rash NEURO: Moves all extremities equally PSYCH: The patient's mood and manner are appropriate. Grooming and personal hygiene are appropriate.  MEDICAL DECISION MAKING: Patient here with atypical right-sided chest pain.  Now chest pain-free.  Work-up in triage is unremarkable.  Doubt ACS, PE or dissection.  He is PERC negative.  EKG shows no ischemic changes.  Initially was hypertensive here in the 140s/100s but this has improved.  Have advised him to follow-up with his PCP with [facility] closely.  We discussed return precautions.  Patient verbalized understanding.  At this time, I do not feel there is any  life-threatening condition present. I have reviewed and discussed all results (EKG, imaging, lab, urine as appropriate) and exam findings with patient/family. I have reviewed nursing notes and appropriate previous records.  I feel the patient is safe to be discharged home without further emergent workup and can continue workup as an outpatient as needed. Discussed usual and customary return precautions.  Patient/family verbalize understanding and are comfortable with this plan.  Outpatient follow-up has been provided as needed. All questions have been answered.      EKG Interpretation  Date/Time:  Monday December 08 2018 23:19:04 EST Ventricular Rate:  80 PR Interval:  144 QRS Duration: 98 QT Interval:  376 QTC Calculation: 433 R Axis:   45 Text Interpretation:  Normal sinus rhythm Normal ECG When compared with ECG of 04/10/2018, No significant change was found Confirmed by Dione Booze ([id number]) on 12/09/2018 2:24:12 AM         , Layla Maw, DO 12/09/18 0630

## 2018-12-09 NOTE — ED Notes (Signed)
Pt asked tech how many pt's were waiting ahead of him. Walked outside.

## 2018-12-28 ENCOUNTER — Emergency Department (HOSPITAL_COMMUNITY)
Admission: EM | Admit: 2018-12-28 | Discharge: 2018-12-28 | Disposition: A | Discharge status: Home / Self Care | Payer: BLUE CROSS/BLUE SHIELD | Attending: Emergency Medicine | Admitting: Emergency Medicine

## 2018-12-28 ENCOUNTER — Encounter (HOSPITAL_COMMUNITY): Payer: Self-pay | Admitting: Emergency Medicine

## 2018-12-28 DIAGNOSIS — I10 Essential (primary) hypertension: Secondary | ICD-10-CM | POA: Diagnosis not present

## 2018-12-28 DIAGNOSIS — R0789 Other chest pain: Secondary | ICD-10-CM

## 2018-12-28 DIAGNOSIS — E785 Hyperlipidemia, unspecified: Secondary | ICD-10-CM | POA: Diagnosis not present

## 2018-12-28 DIAGNOSIS — R5383 Other fatigue: Secondary | ICD-10-CM | POA: Diagnosis not present

## 2018-12-28 DIAGNOSIS — R51 Headache: Secondary | ICD-10-CM | POA: Diagnosis not present

## 2018-12-28 DIAGNOSIS — R079 Chest pain, unspecified: Secondary | ICD-10-CM | POA: Diagnosis present

## 2018-12-28 DIAGNOSIS — Z87891 Personal history of nicotine dependence: Secondary | ICD-10-CM | POA: Insufficient documentation

## 2018-12-28 LAB — COMPREHENSIVE METABOLIC PANEL
ALT: 40 U/L (ref 0–44)
AST: 28 U/L (ref 15–41)
Albumin: 4.1 g/dL (ref 3.5–5.0)
Alkaline Phosphatase: 63 U/L (ref 38–126)
Anion gap: 6 (ref 5–15)
BUN: 12 mg/dL (ref 6–20)
CO2: 27 mmol/L (ref 22–32)
Calcium: 9.2 mg/dL (ref 8.9–10.3)
Chloride: 104 mmol/L (ref 98–111)
Creatinine, Ser: 0.94 mg/dL (ref 0.61–1.24)
GFR calc non Af Amer: >60 mL/min (ref >60)
Glucose, Bld: 89 mg/dL (ref 70–99)
Potassium: 4.1 mmol/L (ref 3.5–5.1)
Sodium: 137 mmol/L (ref 135–145)
Total Bilirubin: 0.7 mg/dL (ref 0.3–1.2)
Total Protein: 7.1 g/dL (ref 6.5–8.1)

## 2018-12-28 LAB — CBC WITH DIFFERENTIAL/PLATELET
Abs Immature Granulocytes: 0.06 10*3/uL (ref 0.00–0.07)
Basophils Absolute: 0.1 10*3/uL (ref 0.0–0.1)
Basophils Relative: 1 %
Eosinophils Absolute: 0.1 10*3/uL (ref 0.0–0.5)
Eosinophils Relative: 1 %
HCT: 45.8 % (ref 39.0–52.0)
HEMOGLOBIN: 15.6 g/dL (ref 13.0–17.0)
Immature Granulocytes: 1 %
LYMPHS ABS: 2.7 10*3/uL (ref 0.7–4.0)
Lymphocytes Relative: 34 %
MCH: 29 pg (ref 26.0–34.0)
MCHC: 34.1 g/dL (ref 30.0–36.0)
MCV: 85.1 fL (ref 80.0–100.0)
Monocytes Absolute: 0.7 10*3/uL (ref 0.1–1.0)
Monocytes Relative: 9 %
NRBC: 0 % (ref 0.0–0.2)
Neutro Abs: 4.3 10*3/uL (ref 1.7–7.7)
Neutrophils Relative %: 54 %
Platelets: 208 10*3/uL (ref 150–400)
RBC: 5.38 MIL/uL (ref 4.22–5.81)
RDW: 13.1 % (ref 11.5–15.5)
WBC: 7.9 10*3/uL (ref 4.0–10.5)

## 2018-12-28 LAB — I-STAT TROPONIN, ED
TROPONIN I, POC: 0.05 ng/mL (ref 0.00–0.08)
Troponin i, poc: 0 ng/mL (ref 0.00–0.08)

## 2018-12-28 LAB — LIPASE, BLOOD: Lipase: 31 U/L (ref 11–51)

## 2018-12-28 MED ORDER — ALUM & MAG HYDROXIDE-SIMETH 200-200-20 MG/5ML PO SUSP
30.0000 mL | Freq: Once | ORAL | Status: AC
  Administered 2018-12-28: 30 mL via ORAL
  Filled 2018-12-28: qty 30

## 2018-12-28 MED ORDER — LIDOCAINE VISCOUS HCL 2 % MT SOLN
15.0000 mL | Freq: Once | OROMUCOSAL | Status: AC
  Administered 2018-12-28: 15 mL via ORAL
  Filled 2018-12-28: qty 15

## 2018-12-28 MED ORDER — FAMOTIDINE 20 MG PO TABS
20.0000 mg | ORAL_TABLET | Freq: Once | ORAL | Status: AC
  Administered 2018-12-28: 20 mg via ORAL
  Filled 2018-12-28: qty 1

## 2018-12-28 MED ORDER — SUCRALFATE 1 GM/10ML PO SUSP: 1.0000 g | Freq: Three times a day (TID) | ORAL | 0 refills | Status: DC

## 2018-12-28 MED ORDER — LISINOPRIL 10 MG PO TABS
10.0000 mg | ORAL_TABLET | Freq: Once | ORAL | Status: AC
  Administered 2018-12-28: 10 mg via ORAL
  Filled 2018-12-28: qty 1

## 2018-12-28 NOTE — ED Triage Notes (Addendum)
Pt here with c/o central CP.  Pt states pain has been intermittent for about a month that radiates to his left arm and shoulder.   Today he became nauseated and felt like he could not take a deep breath.

## 2018-12-28 NOTE — Discharge Instructions (Addendum)
You were seen in the ER for chest pain. Work up today was reassuring.   I suspect your symptoms may be from gastritis, acid reflux or possibly ulcer.  All of these are managed similarly.   We will treat this with anti-acid medicines.  Start taking over the counter omeprazole to 40 mg daily, take on an empty stomach first thing in the morning and wait 20-30 min before eating.  Add famotidine 20 mg in the AM and PM.  Use sucralfate solution 20 min before meals and at bed time.   Avoid irritating foods and liquids such as alcohol, greasy/fatty or acidic foods. Avoid ibuprofen containing products.   Follow up with primary care doctor for further management of this if it persits.   Return to the ER for fever, chills, blood in vomit or stool, worsening localized abdominal pain to right upper or right lower abdomen, inability to tolerate fluids.    Although this is likely GI related you have some cardiac risk factors.  Recommend discussing this with primary care doctor.  Return if there is any exertional chest pain or shortness of breath, palpitations, passing out or near passing out episodes.

## 2018-12-28 NOTE — ED Provider Notes (Signed)
[facility] EMERGENCY DEPARTMENT Provider Note   CSN: [id number] Arrival date & time: 12/28/18  1856    History   Chief Complaint No chief complaint on file.   HPI Anthony Liu is a 38 y.o. male w/ h/o HTN, elevated BMI, HLD, former tobacco use here for evaluation of substernal, constant, chest pressure for the last 2 weeks.  Non pleuritic, non exertional, non positional.  Initially it was intermittent but now constant for several days.  Feels like his acid reflux but worse and now constant associated with nausea, decreased appetite.  Feels like it is better after he burps.  Currently 1/10.  He has tennis elbow on left and this has been flaring up, causing radiating pain from elbow to shoulder.  He had a headache Friday but resolved.  Has been having some facial flushing, congestion, more tired.  Was seen in the ER 2 weeks ago for this and discharged with PCP f/u.  His work up at that time was benign. Grandmother died of MI at age 3.   No vomiting, SOB, cough, diaphoresis, leg pain or swelling.  Has been out of HTN medicine x 6 months.  H/o GERD takes zantac as needed but not recently. No abd surgeries. H/o ETOH or NSAID use. No h/o DVT/PE.  No abd surgeries     HPI  Past Medical History:  Diagnosis Date  . GERD (gastroesophageal reflux disease)   . Gout   . Hypertension     There are no active problems to display for this patient.   History reviewed. No pertinent surgical history.      Home Medications    Prior to Admission medications   Medication Sig Start Date End Date Taking? Authorizing Provider  Ascorbic Acid (VITAMIN C PO) Take 1 tablet by mouth 2 (two) times daily as needed (when cold-like symptoms present).   Yes [provider]  aspirin EC 325 MG tablet Take 325 mg by mouth as needed (for sudden onset of chest pain).   Yes [provider]  indomethacin (INDOCIN) 50 MG capsule Take 50 mg by mouth 3 (three) times daily as needed  (for gout flares).  07/31/18  Yes [provider]  ranitidine (ZANTAC) 150 MG tablet Take 150 mg by mouth daily as needed for heartburn (or reflux).   Yes [provider]  sucralfate (CARAFATE) 1 GM/10ML suspension Take 10 mLs (1 g total) by mouth 4 (four) times daily -  with meals and at bedtime. 12/28/18   Liberty Handy, PA-C    Family History History reviewed. No pertinent family history.  Social History Social History   Tobacco Use  . Smoking status: Former Smoker    Last attempt to quit: 02/23/2015    Years since quitting: 3.8  . Smokeless tobacco: Current User    Types: Snuff  Substance Use Topics  . Alcohol use: Yes  . Drug use: Never     Allergies   Patient has no known allergies.   Review of Systems Review of Systems  Constitutional: Positive for fatigue and fever.  HENT: Positive for congestion.   Gastrointestinal: Positive for abdominal pain and nausea.  Musculoskeletal: Positive for arthralgias.  Neurological: Positive for headaches.  All other systems reviewed and are negative.    Physical Exam Updated Vital Signs BP 134/77 (BP Location: Right Arm)   Pulse 61   Temp 97.8 F (36.6 C) (Oral)   Resp 18   SpO2 98%   Physical Exam Constitutional:  Appearance: He is well-developed.     Comments: NAD. Non toxic.   HENT:     Head: Normocephalic and atraumatic.     Nose: Nose normal.  Eyes:     General: Lids are normal.     Conjunctiva/sclera: Conjunctivae normal.  Neck:     Musculoskeletal: Normal range of motion.     Trachea: Trachea normal.     Comments: Trachea midline.  Cardiovascular:     Rate and Rhythm: Normal rate and regular rhythm.     Pulses:          Carotid pulses are 2+ on the right side and 2+ on the left side.      Radial pulses are 2+ on the right side and 2+ on the left side.       Dorsalis pedis pulses are 2+ on the right side and 2+ on the left side.     Heart sounds: Normal heart sounds, S1 normal and  S2 normal.     Comments: No LE edema or calf tenderness.  Pulmonary:     Effort: Pulmonary effort is normal.     Breath sounds: Normal breath sounds.  Abdominal:     General: Bowel sounds are normal.     Palpations: Abdomen is soft.     Tenderness: There is no abdominal tenderness.     Comments: No epigastric tenderness. No distention.   Skin:    General: Skin is warm and dry.     Capillary Refill: Capillary refill takes less than 2 seconds.     Comments: No rash to chest wall  Neurological:     Mental Status: He is alert.     GCS: GCS eye subscore is 4. GCS verbal subscore is 5. GCS motor subscore is 6.  Psychiatric:        Speech: Speech normal.        Behavior: Behavior normal.        Thought Content: Thought content normal.      ED Treatments / Results  Labs (all labs ordered are listed, but only abnormal results are displayed) Labs Reviewed  CBC WITH DIFFERENTIAL/PLATELET  COMPREHENSIVE METABOLIC PANEL  LIPASE, BLOOD  I-STAT TROPONIN, ED  I-STAT TROPONIN, ED    EKG EKG Interpretation  Date/Time:  Sunday December 28 2018 19:04:30 EST Ventricular Rate:  64 PR Interval:    QRS Duration: 107 QT Interval:  398 QTC Calculation: 411 R Axis:   49 Text Interpretation:  Sinus rhythm since last tracing no significant change Confirmed by Rolan BuccoBelfi, Melanie [phone]([id number]) on 12/28/2018 8:09:04 PM   Radiology No results found.  Procedures Procedures (including critical care time)  Medications Ordered in ED Medications  alum & mag hydroxide-simeth (MAALOX/MYLANTA) 200-200-20 MG/5ML suspension 30 mL (30 mLs Oral Given 12/28/18 2000)  alum & mag hydroxide-simeth (MAALOX/MYLANTA) 200-200-20 MG/5ML suspension 30 mL (30 mLs Oral Given 12/28/18 2000)    And  lidocaine (XYLOCAINE) 2 % viscous mouth solution 15 mL (15 mLs Oral Given 12/28/18 2000)  famotidine (PEPCID) tablet 20 mg (20 mg Oral Given 12/28/18 2000)  lisinopril (PRINIVIL,ZESTRIL) tablet 10 mg (10 mg Oral Given 12/28/18 2132)      Initial Impression / Assessment and Plan / ED Course  I have reviewed the triage vital signs and the nursing notes.  Pertinent labs & imaging results that were available during my care of the patient were reviewed by me and considered in my medical decision making (see chart for details).  38 year old presents with what sounds like atypical chest pain.  Symptoms sound GI related.  Possibly undertreated GERD, PUD, gastritis, biliary colic. He has no RUQ tenderness and unlikely to be cholecystitis.  Chest pain is nonexertional, nonpleuritic.  States it feels like his acid reflux but it has never been constant like this.  Describes vague left elbow pain but states that he has tennis elbow that feels similar.  This is unlikely to be related.  No other concerning features of chest pain such as exertional shortness of breath, palpitations, lightheadedness, fever, cough, shortness of breath or symptoms to suggest blood clot.  His heart score is a 3 given cardiac risk factors including hypertension, elevated BMI, family history.  He is PERC negative.  Pt was seen in the ER similar symptom 2 weeks ago and had normal CXR.  He has no cough, SOB, reproducible chest wall tenderness or trauma to chest.  CXR deferred, pt was ok with this. EKG w/o ischemia, troponin x 2 within normal limits.  Given symptoms, reassuring ED work up, low risk HEART score patient will be discharged with recommendation to follow up with PCP for atypical chest pain vs GI etiology.  Dc with omeprazole, pepcid, carafate.  Refill on lisinopril. Doubt PE, PTX, dissection.   ED return preacutions given. Pt appears reliable for follow up and is agreeable to discharge.   Final Clinical Impressions(s) / ED Diagnoses   Final diagnoses:  Atypical chest pain    ED Discharge Orders         Ordered    sucralfate (CARAFATE) 1 GM/10ML suspension  3 times daily with meals & bedtime     12/28/18 2233           Liberty Handy,  PA-C 12/29/18 0131    Rolan Bucco, MD 12/30/18 [phone]

## 2018-12-28 NOTE — ED Notes (Signed)
Patient Alert and oriented to baseline. Stable and ambulatory to baseline. Patient verbalized understanding of the discharge instructions.  Patient belongings were taken by the patient.   

## 2018-12-28 NOTE — ED Notes (Signed)
ED Provider at bedside. 

## 2018-12-29 DIAGNOSIS — K219 Gastro-esophageal reflux disease without esophagitis: Secondary | ICD-10-CM | POA: Diagnosis not present

## 2018-12-29 DIAGNOSIS — R079 Chest pain, unspecified: Secondary | ICD-10-CM | POA: Diagnosis not present

## 2018-12-29 DIAGNOSIS — E782 Mixed hyperlipidemia: Secondary | ICD-10-CM | POA: Diagnosis not present

## 2018-12-29 DIAGNOSIS — R7301 Impaired fasting glucose: Secondary | ICD-10-CM | POA: Diagnosis not present

## 2018-12-29 DIAGNOSIS — I10 Essential (primary) hypertension: Secondary | ICD-10-CM | POA: Diagnosis not present

## 2019-01-09 ENCOUNTER — Encounter: Payer: Self-pay | Admitting: Gastroenterology

## 2019-01-13 NOTE — Progress Notes (Signed)
[facility] NP records loaded

## 2019-01-13 NOTE — Progress Notes (Deleted)
Subjective:   Anthony Liu, male    DOB: 1980-11-20, 38 y.o.   MRN: [id number]  No chief complaint on file.    Patient referred by Rubbie Battiest), [facility] * for ***  HPI:***  PCP OV Note from 12/29/2018: He has been seen in the ER twice in Feb with Chest Pain, last time on 12/28/2018, CP on and off for 2-3weeks, no SOB. Acid reflux acting up as well, zantac not relieving it anymore.  Has burning in his stomach and his chest. CP mostly right sided and epigastric. Has  been off his BP medicine for 2 months, BP elevated. Gained a lot of weight past year and snpring loudly, wife states he stops breathing when asleep. Restarted Lisinopril 10mg .   Past Medical History:  Diagnosis Date  . GERD (gastroesophageal reflux disease)   . Gout   . Hypertension     No past surgical history on file.  No family history on file.  Social History   Socioeconomic History  . Marital status: Married    Spouse name: Not on file  . Number of children: Not on file  . Years of education: Not on file  . Highest education level: Not on file  Occupational History  . Not on file  Social Needs  . Financial resource strain: Not on file  . Food insecurity:    Worry: Not on file    Inability: Not on file  . Transportation needs:    Medical: Not on file    Non-medical: Not on file  Tobacco Use  . Smoking status: Former Smoker    Last attempt to quit: 02/23/2015    Years since quitting: 3.8  . Smokeless tobacco: Current User    Types: Snuff  Substance and Sexual Activity  . Alcohol use: Yes  . Drug use: Never  . Sexual activity: Not on file  Lifestyle  . Physical activity:    Days per week: Not on file    Minutes per session: Not on file  . Stress: Not on file  Relationships  . Social connections:    Talks on phone: Not on file    Gets together: Not on file    Attends religious service: Not on file    Active member of club or organization: Not on file    Attends meetings of clubs  or organizations: Not on file    Relationship status: Not on file  . Intimate partner violence:    Fear of current or ex partner: Not on file    Emotionally abused: Not on file    Physically abused: Not on file    Forced sexual activity: Not on file  Other Topics Concern  . Not on file  Social History Narrative  . Not on file    Current Outpatient Medications on File Prior to Visit  Medication Sig Dispense Refill  . Ascorbic Acid (VITAMIN C PO) Take 1 tablet by mouth 2 (two) times daily as needed (when cold-like symptoms present).    Marland Kitchen aspirin EC 325 MG tablet Take 325 mg by mouth as needed (for sudden onset of chest pain).    . indomethacin (INDOCIN) 50 MG capsule Take 50 mg by mouth 3 (three) times daily as needed (for gout flares).     . ranitidine (ZANTAC) 150 MG tablet Take 150 mg by mouth daily as needed for heartburn (or reflux).    . sucralfate (CARAFATE) 1 GM/10ML suspension Take 10 mLs (1 g total)  by mouth 4 (four) times daily -  with meals and at bedtime. 420 mL 0   No current facility-administered medications on file prior to visit.      Review of Systems  Constitution: Negative for decreased appetite, malaise/fatigue, weight gain and weight loss.  Eyes: Negative for visual disturbance.  Cardiovascular: Positive for chest pain. Negative for claudication, dyspnea on exertion, leg swelling, orthopnea, palpitations and syncope.  Respiratory: Negative for hemoptysis and wheezing.   Endocrine: Negative for cold intolerance and heat intolerance.  Hematologic/Lymphatic: Does not bruise/bleed easily.  Skin: Negative for nail changes.  Musculoskeletal: Negative for muscle weakness and myalgias.  Gastrointestinal: Negative for abdominal pain, change in bowel habit, nausea and vomiting.  Neurological: Negative for difficulty with concentration, dizziness, focal weakness and headaches.  Psychiatric/Behavioral: Negative for altered mental status and suicidal ideas.  All other  systems reviewed and are negative.      Objective:     There were no vitals taken for this visit.  Physical Exam  Constitutional: He appears well-developed and well-nourished. No distress.  HENT:  Head: Atraumatic.  Eyes: Conjunctivae are normal.  Neck: Neck supple. No JVD present. No thyromegaly present.  Cardiovascular: Normal rate, regular rhythm, normal heart sounds and intact distal pulses. Exam reveals no gallop.  No murmur heard. Pulmonary/Chest: Effort normal and breath sounds normal.  Abdominal: Soft. Bowel sounds are normal.  Musculoskeletal: Normal range of motion.        General: No edema.  Neurological: He is alert.  Skin: Skin is warm and dry.  Psychiatric: He has a normal mood and affect.      Cardiac studies:   EKG ED 12/28/2018: Sinus rhythm. Since last tracing no significant change.    Recent Labs:***  CMP Latest Ref Rng & Units 12/28/2018  Glucose 70 - 99 mg/dL 89  BUN 6 - 20 mg/dL 12  Creatinine 6.96 - 2.95 mg/dL 2.84  Sodium 132 - 440 mmol/L 137  Potassium 3.5 - 5.1 mmol/L 4.1  Chloride 98 - 111 mmol/L 104  CO2 22 - 32 mmol/L 27  Calcium 8.9 - 10.3 mg/dL 9.2  Total Protein 6.5 - 8.1 g/dL 7.1  Total Bilirubin 0.3 - 1.2 mg/dL 0.7  Alkaline Phos 38 - 126 U/L 63  AST 15 - 41 U/L 28  ALT 0 - 44 U/L 40   CBC Latest Ref Rng & Units 12/28/2018  WBC 4.0 - 10.5 K/uL 7.9  Hemoglobin 13.0 - 17.0 g/dL 10.2  Hematocrit 72.5 - 52.0 % 45.8  Platelets 150 - 400 K/uL 208    Lipid Panel:  HbA1C%:    Assessment & Recommendations:   There are no diagnoses linked to this encounter.   Thank you for referring this patient. Please do not hesitate to contact me for any questions.   Altamese Alpine, MSN, APRN, FNP-C [facility] Office: [phone] Fax: [phone]

## 2019-01-14 ENCOUNTER — Ambulatory Visit: Payer: Self-pay | Admitting: Cardiology

## 2019-01-22 ENCOUNTER — Ambulatory Visit: Payer: BLUE CROSS/BLUE SHIELD | Admitting: Adult Health

## 2019-02-02 ENCOUNTER — Other Ambulatory Visit: Payer: Self-pay

## 2019-02-02 ENCOUNTER — Ambulatory Visit (INDEPENDENT_AMBULATORY_CARE_PROVIDER_SITE_OTHER): Payer: BLUE CROSS/BLUE SHIELD | Admitting: Gastroenterology

## 2019-02-02 DIAGNOSIS — R0789 Other chest pain: Secondary | ICD-10-CM | POA: Diagnosis not present

## 2019-02-02 DIAGNOSIS — K219 Gastro-esophageal reflux disease without esophagitis: Secondary | ICD-10-CM

## 2019-02-02 MED ORDER — OMEPRAZOLE 20 MG PO CPDR: 20.0000 mg | DELAYED_RELEASE_CAPSULE | Freq: Two times a day (BID) | ORAL | 3 refills | Status: AC

## 2019-02-02 NOTE — Progress Notes (Signed)
THIS ENCOUNTER IS A VIRTUAL VISIT DUE TO COVID-19 - PATIENT WAS NOT SEEN IN THE OFFICE. PATIENT HAS CONSENTED TO VIRTUAL VISIT / TELEMEDICINE VISIT USING WEB-EX   Location of patient: home Location of provider: office Name of referring provider: Georgette Shell PA Persons participating: myself, patient    HPI :  38 y/o male with a history GERD, morbid obesity, HTN, HLD, referred by Georgette Shell PA for chest pain.   He reports recent visits to the ER in Feb and March for chest pains. He had thought he was having a hard attack and wanted to make sure he was okay. In the ED had EKG without ischemic changes and negative troponins. In review of chart he was also seen in the ED for chest pain in 03/2018 and 01/2017 for chest pain as well with negative findings at that time as well. Since his last ED visit he's had a few episodes of chest pain since then as well as at night. He has been having to sleep in the recliner sleeping with head of bed elevated at times to get relief. Episodes usually occur at night. He feels discomfort in the center of his chest to the right of midline. Does not radiate. It is a burning sensation / sharp discomfort. No odynophagia. No dysphagia. No nausea or vomiting. He doesn't think particular foods will precipitate it. He denies any regurgitation or water brash. He has been having increased belching compared to previous baseline. He thinks this symptom is slightly different from his prior reflux symptoms. Nothing seems to relieve it acutely, episodes can last hours at a time. He has been back on omeprazole 20mg  once daily for the past month. He thinks being on the regimen has eased the episodes off a bit and are not as long lasting. He thinks overall has helped. He is not having exertional chest pain, he can exert himself without reproduction of his symptoms. He has a pending cardiology consult.   He thinks he was diagnosed several years ago with GERD, was taking prilosec several  years ago. Had been doing well and stopped it routinely, was using Zantac PRN which worked okay for him. More recently back on omeprazole 20mg  once daily  Last night he had some abdominal cramps and some diarrhea that bothered him. Usually his bowels are quite regular. He had an episode like this as well 3 weeks ago. No blood in the stools.   He takes aspirin 81mg  once daily, denies any routine NSAIDs. He previously was on indocin but does not use routinely. Takes for tennis elbow.   No prior FH of cancers - esophageal, gastric, colon. No major surgeries  He has lost a bit of weight recently, around 270 lbs he had gained weight last year. Working on lose it. Rare alcohol, no tobacco. Uses dip during the day.     Past Medical History:  Diagnosis Date   GERD (gastroesophageal reflux disease)    Gout    Hyperlipidemia    Hypertension    Morbid obesity (HCC)    Ulnar neuropathy of left upper extremity      No past surgical history on file. Family History  Problem Relation Age of Onset   Hypertension Mother    Hypertension Father    Atrial fibrillation Father    Diabetes Father    Social History   Tobacco Use   Smoking status: Former Smoker    Last attempt to quit: 02/23/2015    Years since quitting: 3.9  Smokeless tobacco: Current User    Types: Snuff  Substance Use Topics   Alcohol use: Yes   Drug use: Never   Current Outpatient Medications  Medication Sig Dispense Refill   Ascorbic Acid (VITAMIN C PO) Take 1 tablet by mouth 2 (two) times daily as needed (when cold-like symptoms present).     aspirin EC 81 MG tablet Take 81 mg by mouth daily.     indomethacin (INDOCIN) 50 MG capsule Take 50 mg by mouth 3 (three) times daily as needed (for gout flares).      lisinopril (PRINIVIL,ZESTRIL) 10 MG tablet Take 10 mg by mouth daily.     omeprazole (PRILOSEC) 20 MG capsule Take 20 mg by mouth daily.     rosuvastatin (CRESTOR) 10 MG tablet Take 10 mg by  mouth at bedtime.     No current facility-administered medications for this visit.    No Known Allergies   Review of Systems: All systems reviewed and negative except where noted in HPI.   Lab Results  Component Value Date   WBC 7.9 12/28/2018   HGB 15.6 12/28/2018   HCT 45.8 12/28/2018   MCV 85.1 12/28/2018   PLT 208 12/28/2018    Lab Results  Component Value Date   CREATININE 0.94 12/28/2018   BUN 12 12/28/2018   NA 137 12/28/2018   K 4.1 12/28/2018   CL 104 12/28/2018   CO2 27 12/28/2018    Lab Results  Component Value Date   ALT 40 12/28/2018   AST 28 12/28/2018   ALKPHOS 63 12/28/2018   BILITOT 0.7 12/28/2018     Physical Exam: NA   ASSESSMENT AND PLAN:  38 y/o male here for new patient assessment of the following issues  Atypical chest pain / GERD - quite possible his symptoms are due to reflux, seem to bother him mostly at night, sleeping upright seems to help. Would be atypical for angina given his course and prior workup but cardiology consultation is pending. I discussed options with him. I recommend he increase his omeprazole to 20mg  BID initially, take 30-60 minutes prior to a meal, and see if that helps more given his response thus far to low dose PPI. Can also take some gaviscon or Maalox PRN to abort symptoms acutely and see if that helps. I ultimately think an EGD would be useful to further evaluate once the COVID-19 outbreak has been controlled and our office resumes normal elective endoscopy schedule. I discussed risks / benefits of an EGD and he wanted to proceed. In the interim, if symptoms continue to bother him despite the increase in dose, can increase to max of 40mg  BID for a few weeks to see if that will control it. Otherwise await cardiology workup. We will touch base with him in 4-6 weeks or so to discuss scheduling EGD. He agreed.    Ileene Patrick, MD [facility]  CC: Georgette Shell PA

## 2019-02-17 ENCOUNTER — Encounter: Payer: Self-pay | Admitting: Cardiology

## 2019-02-17 ENCOUNTER — Ambulatory Visit: Payer: BLUE CROSS/BLUE SHIELD | Admitting: Cardiology

## 2019-02-17 ENCOUNTER — Telehealth: Payer: Self-pay | Admitting: Gastroenterology

## 2019-02-17 ENCOUNTER — Other Ambulatory Visit: Payer: Self-pay

## 2019-02-17 VITALS — Ht 70.0 in | Wt 270.0 lb

## 2019-02-17 DIAGNOSIS — R072 Precordial pain: Secondary | ICD-10-CM

## 2019-02-17 NOTE — Telephone Encounter (Signed)
I don't think the omeprazole would be likely to cause acute diarrhea. I had recommended that he increase omeprazole to 20mg  BID initially after our clinic visit to see if it helped his chest symptoms. I also advised him to take some gaviscon or Maalox PRN to abort symptoms acutely and see if that helps, I don't know if he has tried that. We had planned for an EGD once the [facility] reopens. I would recommend he resume the omeprazole at 20mg  BID and monitor for recurrent diarrhea and see how he does. Thanks

## 2019-02-17 NOTE — Telephone Encounter (Signed)
Pt is still having same issues, he stated that it was difficult to fall sleep last night because he has a lot of pressure on his chest. He stated that he had some issue with diarrhea this past weekend and had to take some medications so he is not sure if that cause sxs from last night. Pls call him.

## 2019-02-17 NOTE — Progress Notes (Signed)
Virtual Visit via Video Note   Subjective:   Anthony Liu, male    DOB: 1981/08/23, 38 y.o.   MRN: [id number]   I connected with the patient on 02/17/19 by a video enabled telemedicine application and verified that I am speaking with the correct person using two identifiers.     I discussed the limitations of evaluation and management by telemedicine and the availability of in person appointments. The patient expressed understanding and agreed to proceed.   This visit type was conducted due to national recommendations for restrictions regarding the COVID-19 Pandemic (e.g. social distancing).  This format is felt to be most appropriate for this patient at this time.  All issues noted in this document were discussed and addressed.  No physical exam was performed (except for noted visual exam findings with Tele health visits).  The patient has consented to conduct a Tele health visit and understands insurance will be billed.     Chief complaint:  Chest pain   HPI  38 y.o. Caucasian male with controlled hypertension, hyperlipidemia, now with chest pain.  Patient has been experiencing chest heaviness for last several weeks. This seems to occur largely at rest and when he lays down at night. This can last for several hours with no change. 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 Occasionally he feels short of breath, although this has not changes over a long period of time. Patient was seen in the ED in 12/2018, with workup with EKG and troponin negative for MI. He has noticed some improvement with Prilosec. He is also seeing GI physician Dr. Adela Lank who will be doing an EGD once COVID restrictions are relaxed.   Past Medical History:  Diagnosis Date  . GERD (gastroesophageal reflux disease)   . Gout   . Hyperlipidemia   . Hypertension   . Morbid obesity (HCC)   . Ulnar neuropathy of left upper extremity       History reviewed. No pertinent surgical history.   Social History   Socioeconomic History  . Marital status: Married    Spouse name: Not on file  . Number of children: Not on file  . Years of education: Not on file  . Highest education level: Not on file  Occupational History  . Not on file  Social Needs  . Financial resource strain: Not on file  . Food insecurity:    Worry: Not on file    Inability: Not on file  . Transportation needs:    Medical: Not on file    Non-medical: Not on file  Tobacco Use  . Smoking status: Former Smoker    Last attempt to quit: 02/23/2015    Years since quitting: 3.9  . Smokeless tobacco: Current User    Types: Snuff  Substance and Sexual Activity  . Alcohol use: Yes  . Drug use: Never  . Sexual activity: Not on file  Lifestyle  . Physical activity:    Days per week: Not on file    Minutes per session: Not on file  . Stress: Not on file  Relationships  . Social connections:    Talks on phone: Not on file    Gets together: Not on file    Attends religious service: Not on file    Active member of club or organization: Not on file    Attends meetings of clubs or organizations: Not on file    Relationship  status: Not on file  . Intimate partner violence:    Fear of current or ex partner: Not on file    Emotionally abused: Not on file    Physically abused: Not on file    Forced sexual activity: Not on file  Other Topics Concern  . Not on file  Social History Narrative  . Not on file     Family History  Problem Relation Age of Onset  . Hypertension Mother   . Hypertension Father   . Atrial fibrillation Father   . Diabetes Father      Current Outpatient Medications on File Prior to Visit  Medication Sig Dispense Refill  . Ascorbic Acid (VITAMIN C PO) Take 1 tablet by mouth 2 (two) times daily as needed (when cold-like symptoms present).    Marland Kitchen aspirin EC 81 MG tablet Take 81 mg by mouth daily.    . indomethacin (INDOCIN)  50 MG capsule Take 50 mg by mouth 3 (three) times daily as needed (for gout flares).     Marland Kitchen lisinopril (PRINIVIL,ZESTRIL) 10 MG tablet Take 10 mg by mouth daily.    Marland Kitchen omeprazole (PRILOSEC) 20 MG capsule Take 1 capsule (20 mg total) by mouth 2 (two) times daily. 180 capsule 3  . rosuvastatin (CRESTOR) 10 MG tablet Take 10 mg by mouth at bedtime.     No current facility-administered medications on file prior to visit.     Cardiovascular studies:  EKG 12/28/2018: Sinus rhythm 64 bm. Normal EKG   Recent labs: Results for Anthony Liu, Anthony Liu (MRN [id number]) as of 02/17/2019 12:50  Ref. Range 12/28/2018 20:07  COMPREHENSIVE METABOLIC PANEL Unknown Rpt  Sodium Latest Ref Range: 135 - 145 mmol/L 137  Potassium Latest Ref Range: 3.5 - 5.1 mmol/L 4.1  Chloride Latest Ref Range: 98 - 111 mmol/L 104  CO2 Latest Ref Range: 22 - 32 mmol/L 27  Glucose Latest Ref Range: 70 - 99 mg/dL 89  BUN Latest Ref Range: 6 - 20 mg/dL 12  Creatinine Latest Ref Range: 0.61 - 1.24 mg/dL 2.95  Calcium Latest Ref Range: 8.9 - 10.3 mg/dL 9.2  Anion gap Latest Ref Range: 5 - 15  6  Alkaline Phosphatase Latest Ref Range: 38 - 126 U/L 63  Albumin Latest Ref Range: 3.5 - 5.0 g/dL 4.1  Lipase Latest Ref Range: 11 - 51 U/L 31  AST Latest Ref Range: 15 - 41 U/L 28  ALT Latest Ref Range: 0 - 44 U/L 40  Total Protein Latest Ref Range: 6.5 - 8.1 g/dL 7.1  Total Bilirubin Latest Ref Range: 0.3 - 1.2 mg/dL 0.7  GFR, Est Non African American Latest Ref Range: >60 mL/min >60  GFR, Est African American Latest Ref Range: >60 mL/min >60   Results for Anthony Liu, Anthony Liu (MRN [id number]) as of 02/17/2019 12:50  Ref. Range 12/28/2018 20:07  COMPREHENSIVE METABOLIC PANEL Unknown Rpt  Sodium Latest Ref Range: 135 - 145 mmol/L 137  Potassium Latest Ref Range: 3.5 - 5.1 mmol/L 4.1  Chloride Latest Ref Range: 98 - 111 mmol/L 104  CO2 Latest Ref Range: 22 - 32 mmol/L 27  Glucose Latest Ref Range: 70 - 99 mg/dL 89  BUN Latest Ref Range: 6 - 20  mg/dL 12  Creatinine Latest Ref Range: 0.61 - 1.24 mg/dL 1.02  Calcium Latest Ref Range: 8.9 - 10.3 mg/dL 9.2  Anion gap Latest Ref Range: 5 - 15  6  Alkaline Phosphatase Latest Ref Range: 38 - 126 U/L 63  Albumin Latest  Ref Range: 3.5 - 5.0 g/dL 4.1  Lipase Latest Ref Range: 11 - 51 U/L 31  AST Latest Ref Range: 15 - 41 U/L 28  ALT Latest Ref Range: 0 - 44 U/L 40  Total Protein Latest Ref Range: 6.5 - 8.1 g/dL 7.1  Total Bilirubin Latest Ref Range: 0.3 - 1.2 mg/dL 0.7  GFR, Est Non African American Latest Ref Range: >60 mL/min >60  GFR, Est African American Latest Ref Range: >60 mL/min >60    Review of Systems  Constitution: Negative for decreased appetite, malaise/fatigue, weight gain and weight loss.  HENT: Negative for congestion.   Eyes: Negative for visual disturbance.  Cardiovascular: Positive for chest pain. Negative for dyspnea on exertion, leg swelling, palpitations and syncope.  Respiratory: Positive for shortness of breath.   Endocrine: Negative for cold intolerance.  Hematologic/Lymphatic: Does not bruise/bleed easily.  Skin: Negative for itching and rash.  Musculoskeletal: Negative for myalgias.  Gastrointestinal: Negative for abdominal pain, nausea and vomiting.  Genitourinary: Negative for dysuria.  Neurological: Negative for dizziness and weakness.  Psychiatric/Behavioral: The patient is not nervous/anxious.   All other systems reviewed and are negative.        Vitals:  None checked recently. 110/80 when checked by PCP, as per the patient (Measured by the patient using a home BP monitor)   Observation/findings during video visit   Objective:    Physical Exam  Constitutional: He is oriented to person, place, and time. He appears well-developed and well-nourished. No distress.  Pulmonary/Chest: Effort normal.  Musculoskeletal:        General: No edema.  Neurological: He is alert and oriented to person, place, and time.  Psychiatric: He has a normal  mood and affect.  Nursing note and vitals reviewed.         Assessment & Recommendations:   38 y/o Caucasian male with controlled hypertension, hyperlipidemia, now with chest pain.  Chest pain: Unlikely to be angina, by history Reassuring EKG and troponins while in the ED in 12/2018. Okay to stop ASA in case GERD is thought to be the etiology. If GI workup negative, and true angina symptoms occur, we can perform a stress test in the future.   Continue current meds for hypertension and hyperlipidemia.     Elder NegusManish J Tayler Heiden, MD [facility] Pager: [phone] Office: [phone] If no answer Cell [phone]

## 2019-02-17 NOTE — Telephone Encounter (Signed)
Patient called and given Dr. Lavera Guise recommendations for meds. And to let us know if diarrhea comes back. Will schedule EGD when COVID-19 restrictions pass

## 2019-02-17 NOTE — Telephone Encounter (Signed)
Patient called and c/o sever Diarrhia and stomach cramping on Friday night. He had been on his second day of Indometacin. So he stopped that and diarrhea has stopped. But he also stopped his Prilosec since Sunday because he read that could cause diarrhea. The last 2 days (esp. At night) he has had heavy pressure in his chest. He spoke to his Cardiologist today and he thinks it is GI. He also told him to come off the Asprin 81 mg. That his PCP had put him on, thinking this might be an Ulcer. Please advise

## 2019-02-18 ENCOUNTER — Encounter: Payer: Self-pay | Admitting: Cardiology

## 2019-02-18 DIAGNOSIS — R072 Precordial pain: Secondary | ICD-10-CM | POA: Insufficient documentation

## 2019-03-26 ENCOUNTER — Telehealth: Payer: Self-pay | Admitting: Gastroenterology

## 2019-03-26 NOTE — Telephone Encounter (Signed)
Left message for patient to return call.  Patient saw Dr. Adela Lank in April/2020. Patient needs to be scheduled for a pre visit and EGD. Dx: atypical chest pain, GERD.

## 2019-04-01 NOTE — Telephone Encounter (Signed)
Called and LM for pt to call back and schedule an EGD with Armbruster.

## 2019-04-08 DIAGNOSIS — Z1159 Encounter for screening for other viral diseases: Secondary | ICD-10-CM | POA: Diagnosis not present

## 2019-04-09 ENCOUNTER — Telehealth: Payer: Self-pay

## 2019-04-09 NOTE — Telephone Encounter (Signed)
Lm for pt to call back to discuss scheduling an EGD with Dr. Havery Moros if he is still interested

## 2019-04-30 NOTE — Telephone Encounter (Signed)
lvm to call back to scheduled a EGD w/ pre visit

## 2019-05-12 ENCOUNTER — Telehealth: Payer: Self-pay

## 2019-05-12 NOTE — Telephone Encounter (Signed)
Letter sent to pt asking him to call and schedule EGD if he is still interested

## 2019-07-21 IMAGING — DX DG CHEST 2V
2 series · 2 of 2 positions shown · non-contrast
Comparison: 02/22/2017

CLINICAL DATA: Chest pain.  Left arm tingling.

EXAM:
CHEST - 2 VIEW

[w chest pa]
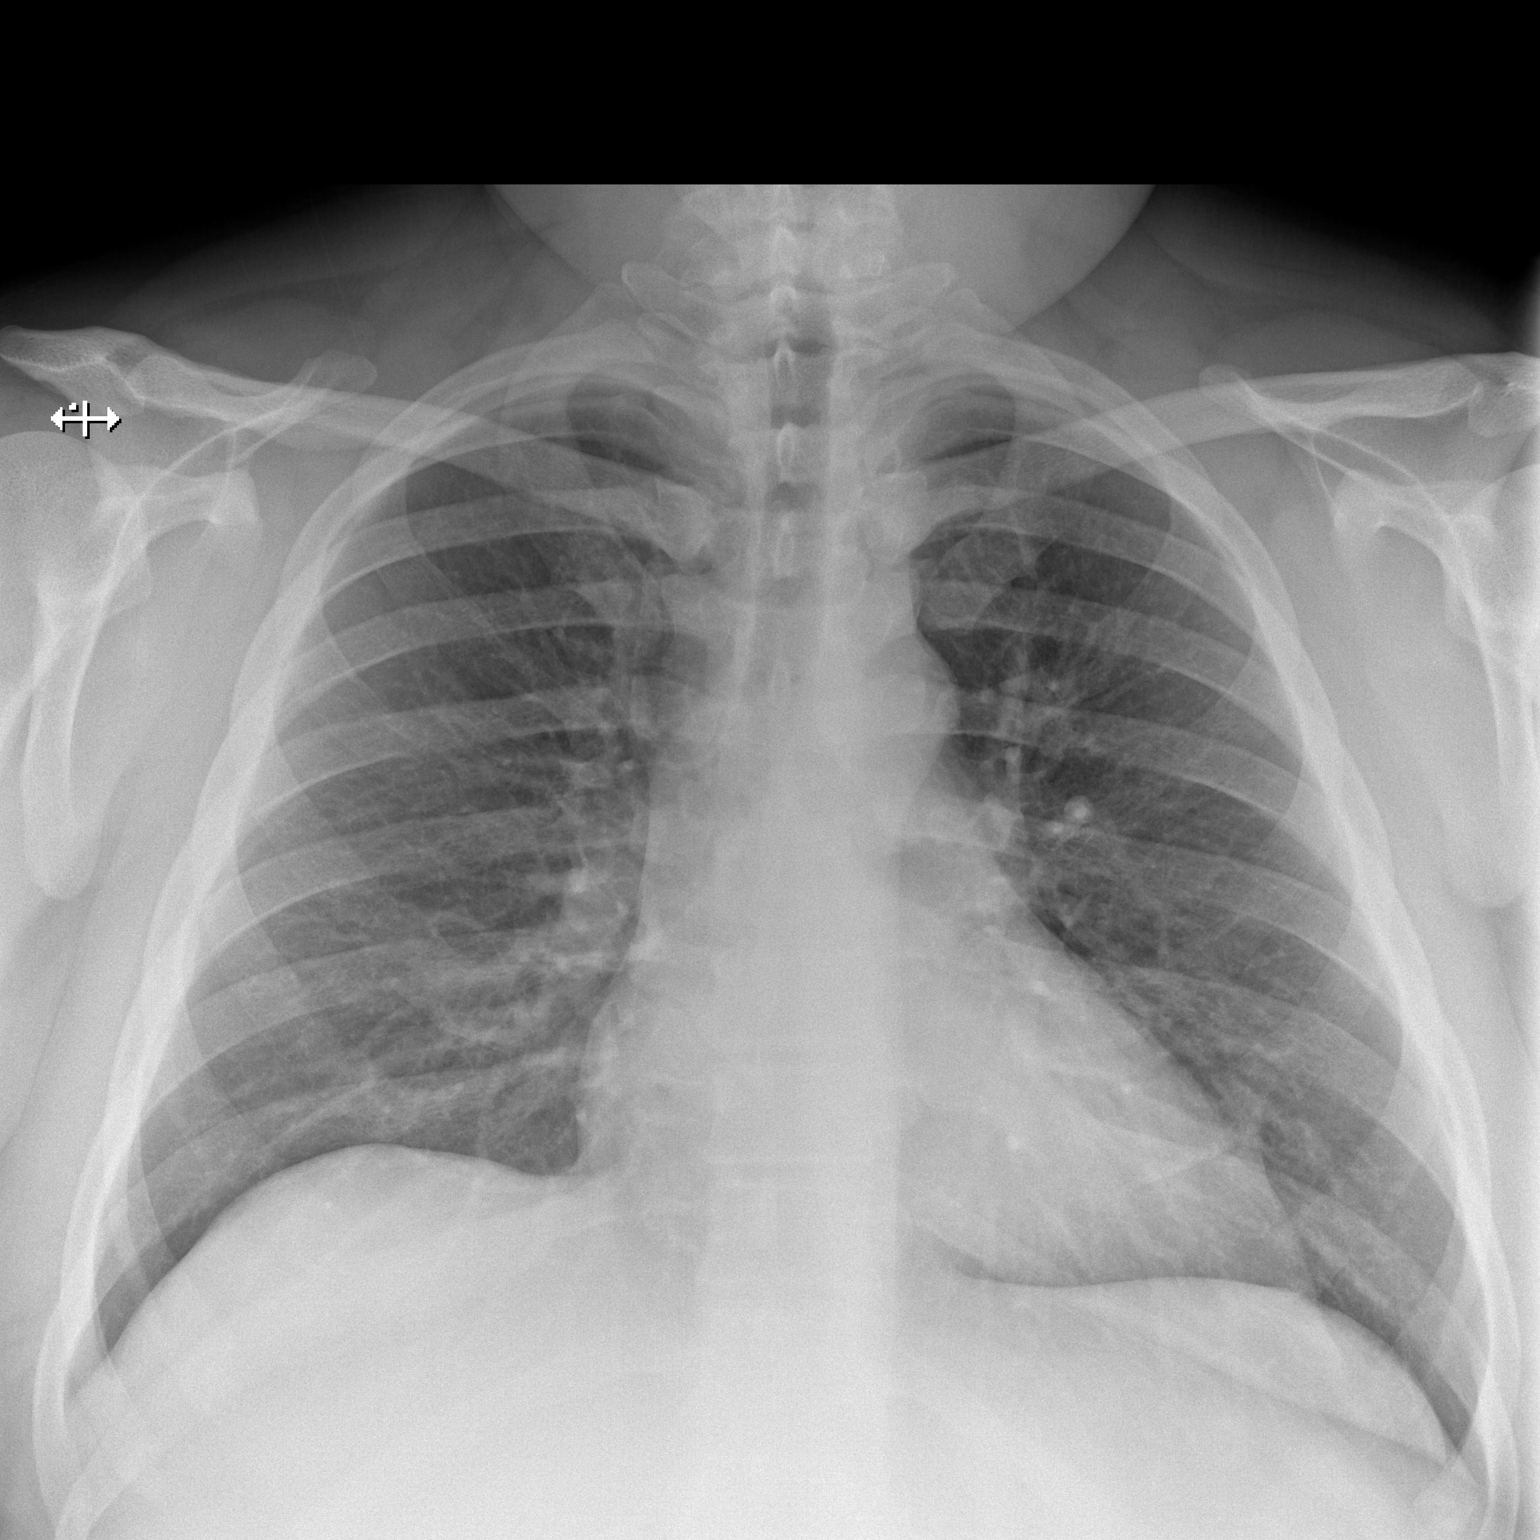

[w chest lat]
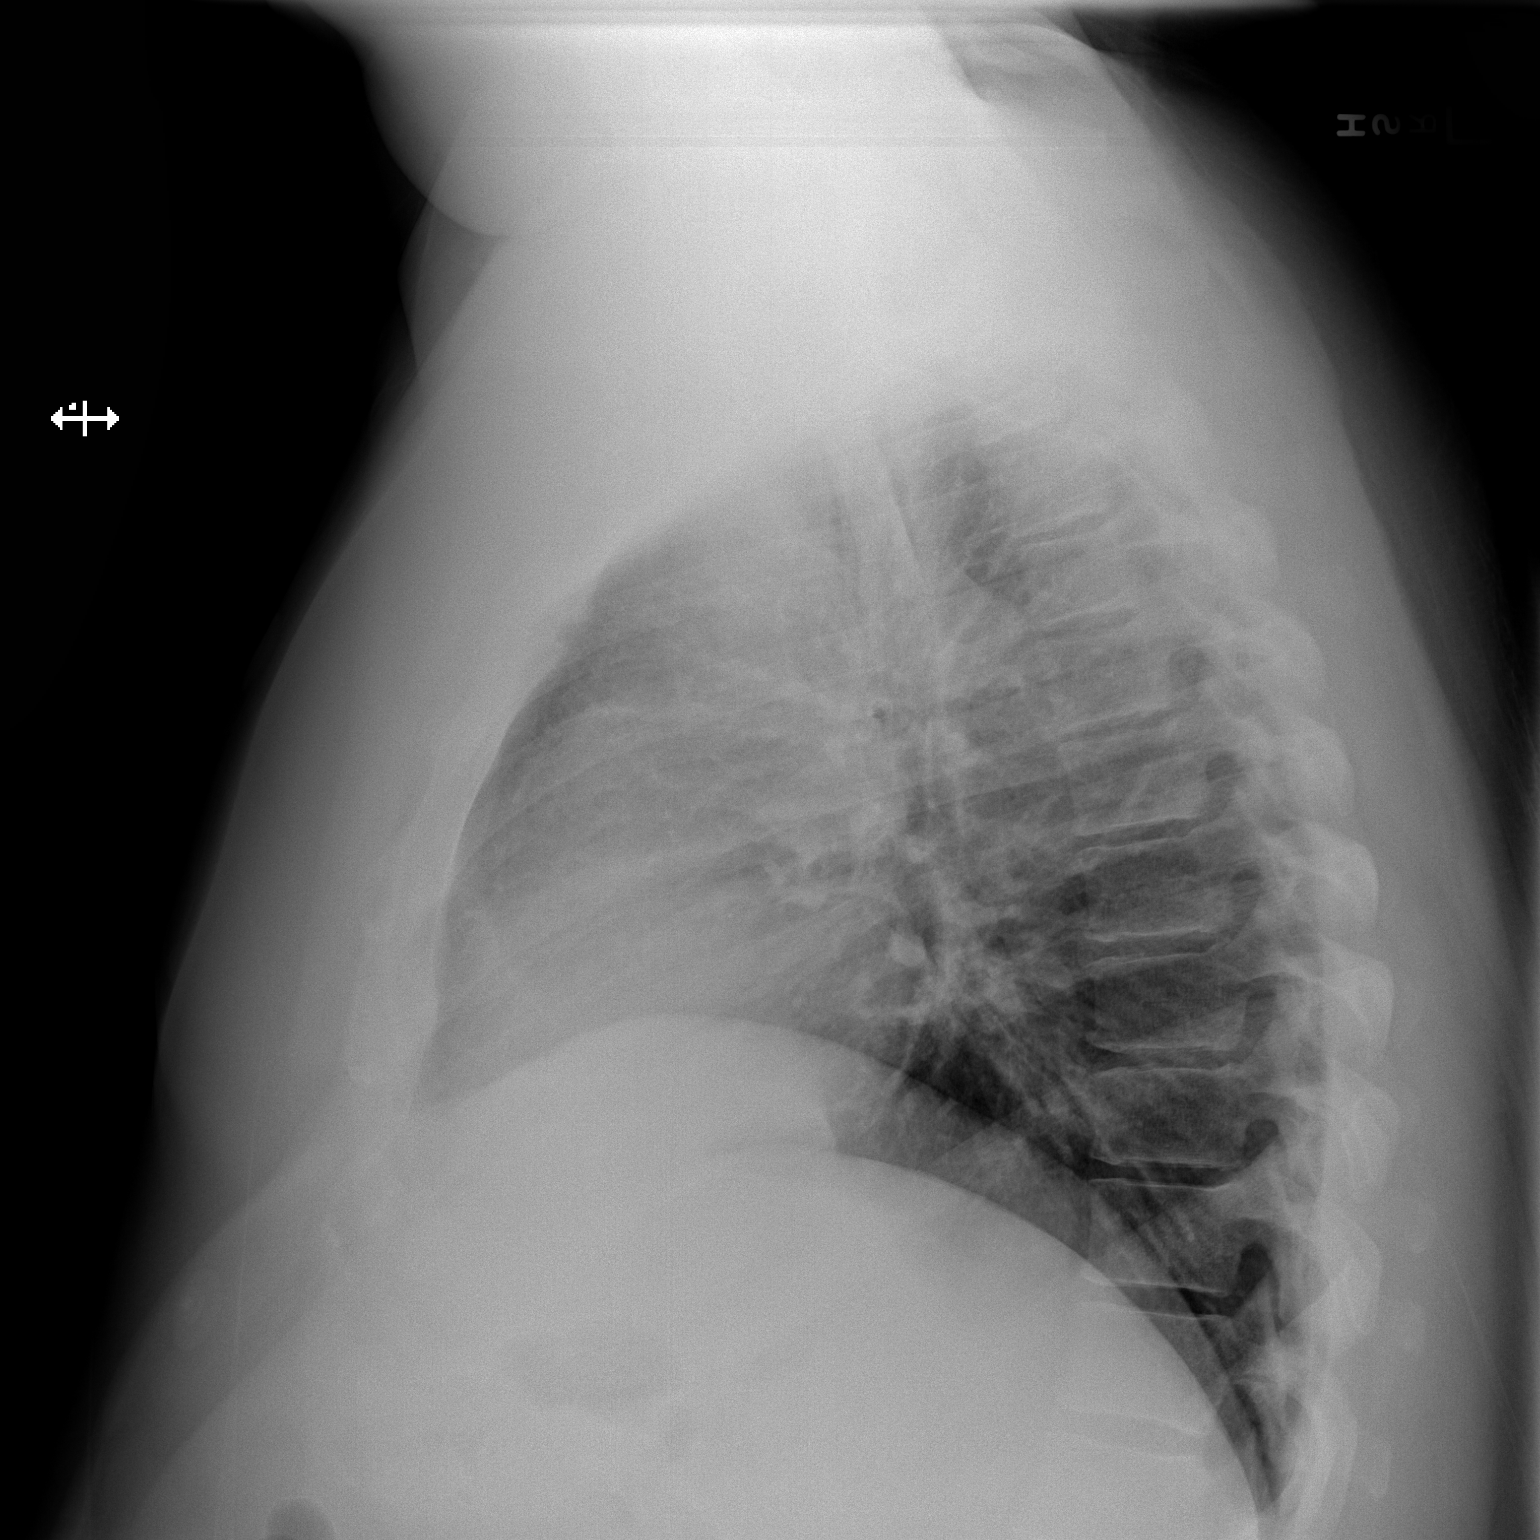

[2 of 2 positions shown; findings below may reference images not displayed]

FINDINGS: The cardiomediastinal contours are normal. The lungs are clear.
Pulmonary vasculature is normal. No consolidation, pleural effusion,
or pneumothorax. No acute osseous abnormalities are seen.
IMPRESSION: No acute pulmonary process.

## 2019-10-26 DIAGNOSIS — Z20828 Contact with and (suspected) exposure to other viral communicable diseases: Secondary | ICD-10-CM | POA: Diagnosis not present

## 2019-11-04 DIAGNOSIS — Z20822 Contact with and (suspected) exposure to covid-19: Secondary | ICD-10-CM | POA: Diagnosis not present

## 2019-11-06 DIAGNOSIS — U071 COVID-19: Secondary | ICD-10-CM | POA: Diagnosis not present

## 2019-11-06 DIAGNOSIS — Z03818 Encounter for observation for suspected exposure to other biological agents ruled out: Secondary | ICD-10-CM | POA: Diagnosis not present

## 2019-12-10 DIAGNOSIS — S86911A Strain of unspecified muscle(s) and tendon(s) at lower leg level, right leg, initial encounter: Secondary | ICD-10-CM | POA: Diagnosis not present

## 2019-12-10 DIAGNOSIS — S8991XA Unspecified injury of right lower leg, initial encounter: Secondary | ICD-10-CM | POA: Diagnosis not present

## 2019-12-10 DIAGNOSIS — M25561 Pain in right knee: Secondary | ICD-10-CM | POA: Diagnosis not present

## 2020-01-08 DIAGNOSIS — E785 Hyperlipidemia, unspecified: Secondary | ICD-10-CM | POA: Diagnosis not present

## 2020-01-08 DIAGNOSIS — I10 Essential (primary) hypertension: Secondary | ICD-10-CM | POA: Diagnosis not present

## 2020-01-08 DIAGNOSIS — Z131 Encounter for screening for diabetes mellitus: Secondary | ICD-10-CM | POA: Diagnosis not present

## 2021-02-07 DIAGNOSIS — N529 Male erectile dysfunction, unspecified: Secondary | ICD-10-CM | POA: Diagnosis not present

## 2021-02-07 DIAGNOSIS — E785 Hyperlipidemia, unspecified: Secondary | ICD-10-CM | POA: Diagnosis not present

## 2021-02-07 DIAGNOSIS — I10 Essential (primary) hypertension: Secondary | ICD-10-CM | POA: Diagnosis not present

## 2021-02-07 DIAGNOSIS — K219 Gastro-esophageal reflux disease without esophagitis: Secondary | ICD-10-CM | POA: Diagnosis not present

## 2021-04-13 DIAGNOSIS — T7840XA Allergy, unspecified, initial encounter: Secondary | ICD-10-CM | POA: Diagnosis not present

## 2021-04-13 DIAGNOSIS — H01115 Allergic dermatitis of left lower eyelid: Secondary | ICD-10-CM | POA: Diagnosis not present

## 2021-04-13 DIAGNOSIS — H01114 Allergic dermatitis of left upper eyelid: Secondary | ICD-10-CM | POA: Diagnosis not present

## 2021-04-13 DIAGNOSIS — Z6841 Body Mass Index (BMI) 40.0 and over, adult: Secondary | ICD-10-CM | POA: Diagnosis not present

## 2022-03-25 ENCOUNTER — Emergency Department (HOSPITAL_BASED_OUTPATIENT_CLINIC_OR_DEPARTMENT_OTHER)
Admission: EM | Admit: 2022-03-25 | Discharge: 2022-03-25 | Disposition: A | Discharge status: Home / Self Care | Payer: Commercial Managed Care - PPO | Attending: Emergency Medicine | Admitting: Emergency Medicine

## 2022-03-25 ENCOUNTER — Emergency Department (HOSPITAL_BASED_OUTPATIENT_CLINIC_OR_DEPARTMENT_OTHER): Payer: Commercial Managed Care - PPO

## 2022-03-25 ENCOUNTER — Encounter (HOSPITAL_BASED_OUTPATIENT_CLINIC_OR_DEPARTMENT_OTHER): Payer: Self-pay | Admitting: Emergency Medicine

## 2022-03-25 ENCOUNTER — Other Ambulatory Visit: Payer: Self-pay

## 2022-03-25 DIAGNOSIS — Z7982 Long term (current) use of aspirin: Secondary | ICD-10-CM | POA: Insufficient documentation

## 2022-03-25 DIAGNOSIS — R202 Paresthesia of skin: Secondary | ICD-10-CM

## 2022-03-25 DIAGNOSIS — R200 Anesthesia of skin: Secondary | ICD-10-CM | POA: Diagnosis present

## 2022-03-25 DIAGNOSIS — I10 Essential (primary) hypertension: Secondary | ICD-10-CM | POA: Insufficient documentation

## 2022-03-25 DIAGNOSIS — Z79899 Other long term (current) drug therapy: Secondary | ICD-10-CM | POA: Insufficient documentation

## 2022-03-25 LAB — CBC WITH DIFFERENTIAL/PLATELET
Abs Immature Granulocytes: 0.04 10*3/uL (ref 0.00–0.07)
Basophils Absolute: 0.1 10*3/uL (ref 0.0–0.1)
Basophils Relative: 1 %
Eosinophils Absolute: 0.2 10*3/uL (ref 0.0–0.5)
Eosinophils Relative: 2 %
HCT: 45 % (ref 39.0–52.0)
Hemoglobin: 15.5 g/dL (ref 13.0–17.0)
Immature Granulocytes: 0 %
Lymphocytes Relative: 21 %
Lymphs Abs: 2.1 10*3/uL (ref 0.7–4.0)
MCH: 29 pg (ref 26.0–34.0)
MCHC: 34.4 g/dL (ref 30.0–36.0)
MCV: 84.3 fL (ref 80.0–100.0)
Monocytes Absolute: 0.9 10*3/uL (ref 0.1–1.0)
Monocytes Relative: 9 %
Neutro Abs: 6.9 10*3/uL (ref 1.7–7.7)
Neutrophils Relative %: 67 %
Platelets: 212 10*3/uL (ref 150–400)
RBC: 5.34 MIL/uL (ref 4.22–5.81)
RDW: 12.9 % (ref 11.5–15.5)
WBC: 10.2 10*3/uL (ref 4.0–10.5)
nRBC: 0 % (ref 0.0–0.2)

## 2022-03-25 LAB — CBG MONITORING, ED: Glucose-Capillary: 93 mg/dL (ref 70–99)

## 2022-03-25 LAB — BASIC METABOLIC PANEL
Anion gap: 9 (ref 5–15)
BUN: 15 mg/dL (ref 6–20)
CO2: 28 mmol/L (ref 22–32)
Calcium: 9.4 mg/dL (ref 8.9–10.3)
Chloride: 103 mmol/L (ref 98–111)
Creatinine, Ser: 0.92 mg/dL (ref 0.61–1.24)
GFR, Estimated: >60 mL/min (ref >60)
Glucose, Bld: 88 mg/dL (ref 70–99)
Potassium: 3.7 mmol/L (ref 3.5–5.1)
Sodium: 140 mmol/L (ref 135–145)

## 2022-03-25 NOTE — ED Triage Notes (Signed)
Pt arrives POV ambulatory c/o head pain that started 1 week ago. Pain is dull and on top of head. States the pain doesn't feel like a headache. Reports tinglings and changes in sensation that started today. Hx of tennis elbow in left arm.   Upon assessment, pt has less sensation in left cheek and left sided facial droop. Pt reports noticing tinging and change in sensation around 1230 today. Denies weakness, difficulty walking, balance issues, changes in vision. MD in triage to assess.

## 2022-03-25 NOTE — ED Notes (Signed)
Dr Charm Barges into update pt and wife

## 2022-03-25 NOTE — ED Provider Notes (Signed)
[facility] EMERGENCY DEPARTMENT Provider Note   CSN: [id number] Arrival date & time: 03/25/22  1423     History  Chief Complaint  Patient presents with   Numbness    Anthony Liu is a 41 y.o. male.  He has a history of hypertension hyperlipidemia.  He said he had a sinus infection a few weeks ago with some left-sided pressure, treated with amoxicillin and steroid nasal spray.  He is continuing to use the nasal spray.  For the last week or so he has noticed some tingling on the top of his head with some intermittent stabbing pain.  Today he noticed some tingling mostly over his left cheek and may be a little numbness in his left fourth and fifth finger although he said he had an intermittent tennis elbow problem.  No clumsiness no weakness.  No visual symptoms.  No difficulty with balance.  No speech difficulty.  He feels his symptoms are improving but not fully resolved.  The history is provided by the patient.      Home Medications Prior to Admission medications   Medication Sig Start Date End Date Taking? Authorizing Provider  Ascorbic Acid (VITAMIN C PO) Take 1 tablet by mouth 2 (two) times daily as needed (when cold-like symptoms present).    [provider]  aspirin EC 81 MG tablet Take 81 mg by mouth daily.    [provider]  indomethacin (INDOCIN) 50 MG capsule Take 50 mg by mouth 3 (three) times daily as needed (for gout flares).  07/31/18   [provider]  lisinopril (PRINIVIL,ZESTRIL) 10 MG tablet Take 10 mg by mouth daily.    [provider]  omeprazole (PRILOSEC) 20 MG capsule Take 1 capsule (20 mg total) by mouth 2 (two) times daily. Patient taking differently: Take 20 mg by mouth daily.  02/02/19   Armbruster, Willaim RayasSteven P, MD  rosuvastatin (CRESTOR) 10 MG tablet Take 10 mg by mouth at bedtime.    [provider]      Allergies    Patient has no known allergies.    Review of Systems   Review of Systems   Constitutional:  Negative for fever.  HENT:  Positive for sinus pressure.   Eyes:  Negative for visual disturbance.  Respiratory:  Negative for cough.   Cardiovascular:  Negative for chest pain.  Gastrointestinal:  Negative for abdominal pain.  Genitourinary:  Negative for dysuria.  Musculoskeletal:  Negative for neck pain.  Skin:  Negative for rash.  Neurological:  Positive for numbness. Negative for facial asymmetry, speech difficulty and weakness.   Physical Exam Updated Vital Signs BP (!) 152/95 (BP Location: Right Arm)   Pulse 78   Temp 98.3 F (36.8 C) (Oral)   Resp 18   Ht 5\' 10"  (1.778 m)   Wt 131.5 kg   SpO2 100%   BMI 41.61 kg/m  Physical Exam Vitals and nursing note reviewed.  Constitutional:      General: He is not in acute distress.    Appearance: Normal appearance. He is well-developed.  HENT:     Head: Normocephalic and atraumatic.     Nose: Nose normal.     Mouth/Throat:     Mouth: Mucous membranes are moist.     Pharynx: Oropharynx is clear.  Eyes:     Conjunctiva/sclera: Conjunctivae normal.  Cardiovascular:     Rate and Rhythm: Normal rate and regular rhythm.     Heart sounds: No murmur heard. Pulmonary:  Effort: Pulmonary effort is normal. No respiratory distress.     Breath sounds: Normal breath sounds.  Abdominal:     Palpations: Abdomen is soft.     Tenderness: There is no abdominal tenderness. There is no guarding or rebound.  Musculoskeletal:        General: No swelling.     Cervical back: Neck supple.     Right lower leg: No edema.     Left lower leg: No edema.  Skin:    General: Skin is warm and dry.     Capillary Refill: Capillary refill takes less than 2 seconds.  Neurological:     Mental Status: He is alert and oriented to person, place, and time.     Cranial Nerves: No cranial nerve deficit.     Sensory: No sensory deficit.     Motor: No weakness.     Gait: Gait normal.    ED Results / Procedures / Treatments    Labs (all labs ordered are listed, but only abnormal results are displayed) Labs Reviewed  BASIC METABOLIC PANEL  CBC WITH DIFFERENTIAL/PLATELET  CBG MONITORING, ED    EKG EKG Interpretation  Date/Time:  Sunday Mar 25 2022 14:56:41 EDT Ventricular Rate:  70 PR Interval:  158 QRS Duration: 114 QT Interval:  421 QTC Calculation: 455 R Axis:   65 Text Interpretation: Sinus rhythm Borderline intraventricular conduction delay No significant change since prior 3/20 Confirmed by Meridee Score [phone]) on 03/25/2022 3:03:45 PM  Radiology CT Head Wo Contrast  Result Date: 03/25/2022 CLINICAL DATA:  Frontal headache.  Symptoms began 1 week ago. EXAM: CT HEAD WITHOUT CONTRAST TECHNIQUE: Contiguous axial images were obtained from the base of the skull through the vertex without intravenous contrast. RADIATION DOSE REDUCTION: This exam was performed according to the departmental dose-optimization program which includes automated exposure control, adjustment of the mA and/or kV according to patient size and/or use of iterative reconstruction technique. COMPARISON:  Head CT, 02/23/2017. FINDINGS: Brain: No evidence of acute infarction, hemorrhage, hydrocephalus, extra-axial collection or mass lesion/mass effect. Vascular: No hyperdense vessel or unexpected calcification. Skull: Normal. Negative for fracture or focal lesion. Sinuses/Orbits: Globes and orbits are unremarkable. Sinuses are clear. Other: None. IMPRESSION: Negative unenhanced CT scan of the brain. Electronically Signed   By: Amie Portland M.D.   On: 03/25/2022 15:30    Procedures Procedures    Medications Ordered in ED Medications - No data to display  ED Course/ Medical Decision Making/ A&P Clinical Course as of 03/26/22 1015  Sun Mar 25, 2022  1738 Reviewed case with Dr. Selina Cooley neurology.  She thought it would be appropriate for him to follow-up outpatient with PCP and neurology.  Reviewed with patient and outpatient neurology  referral placed.  Return instructions discussed. [MB]    Clinical Course User Index [MB] Terrilee Files, MD                           Medical Decision Making  This patient complains of numbness over his left cheek and the back of his left arm, left fourth and fifth fingers; this involves an extensive number of treatment Options and is a complaint that carries with it a high risk of complications and morbidity. The differential includes stroke, MS, radiculopathy, peripheral nerve, metabolic derangement, sinusitis, early Bell's palsy  I ordered, reviewed and interpreted labs, which included CBC normal, chemistries normal, glucose normal I ordered imaging studies which included head CT and I  independently    visualized and interpreted imaging which showed no acute findings Additional history obtained from patient significant other Previous records obtained and reviewed in epic no recent admissions I consulted Dr. Selina Cooley neurology and discussed lab and imaging findings and discussed disposition.  Cardiac monitoring reviewed, normal sinus rhythm Social determinants considered, no significant barriers Critical Interventions: None  After the interventions stated above, I reevaluated the patient and found patient to be minimally symptomatic and hemodynamically stable Admission and further testing considered, no indications for admission or further work-up at this time.  Will give referral to neurology clinic.  Return instructions discussed.         Final Clinical Impression(s) / ED Diagnoses Final diagnoses:  Paresthesias    Rx / DC Orders ED Discharge Orders     None         Terrilee Files, MD 03/26/22 1017

## 2022-03-25 NOTE — Discharge Instructions (Signed)
You were seen in the emergency department for some left face and left upper arm and hand numbness.  You had blood work and a CAT scan that did not show an obvious explanation for your symptoms.  We have put in a referral for outpatient neurology for you.  If you have any new symptoms please return to the emergency department.

## 2022-04-10 ENCOUNTER — Encounter: Payer: Self-pay | Admitting: Diagnostic Neuroimaging

## 2022-04-10 ENCOUNTER — Ambulatory Visit: Payer: Commercial Managed Care - PPO | Admitting: Diagnostic Neuroimaging

## 2022-04-10 VITALS — BP 136/88 | HR 72 | Ht 70.0 in | Wt 298.0 lb

## 2022-04-10 DIAGNOSIS — R200 Anesthesia of skin: Secondary | ICD-10-CM | POA: Diagnosis not present

## 2022-04-10 DIAGNOSIS — R519 Headache, unspecified: Secondary | ICD-10-CM

## 2022-04-10 NOTE — Progress Notes (Unsigned)
[facility]  PATIENT: Anthony Liu DOB: December 16, 1980  REFERRING CLINICIAN: Hayden Rasmussen, MD HISTORY FROM: patient REASON FOR VISIT: new consult   HISTORICAL  CHIEF COMPLAINT:  Chief Complaint  Patient presents with   Parasthesias    Rm 6 New Pt, ED ref wife- Lorriane Shire "headaches in top of head, one day the left side of face went numb, arm went numb, occurs off and on, Tylenol takes the edge off; lots of stress at work"     HISTORY OF PRESENT ILLNESS:   41 year old male here for evaluation of headaches.  Has had left-sided headaches for past 2 weeks.  Pain behind left eye.  Left face tingling.  Having some tension in the back of head and neck.  In his 40s patient had to 3 headaches with migraine features.    Now having almost daily headache pain.  Has been taking Tylenol.  Had more stress at work.  Wife notes some snoring and witnessed apnea during sleep.   REVIEW OF SYSTEMS: Full 14 system review of systems performed and negative with exception of: AS PER hpi.  ALLERGIES: No Known Allergies  HOME MEDICATIONS: Outpatient Medications Prior to Visit  Medication Sig Dispense Refill   Ascorbic Acid (VITAMIN C PO) Take 1 tablet by mouth 2 (two) times daily as needed (when cold-like symptoms present).     indomethacin (INDOCIN) 50 MG capsule Take 50 mg by mouth 3 (three) times daily as needed (for gout flares).      lisinopril (PRINIVIL,ZESTRIL) 10 MG tablet Take 10 mg by mouth daily.     omeprazole (PRILOSEC) 20 MG capsule Take 1 capsule (20 mg total) by mouth 2 (two) times daily. (Patient taking differently: Take 20 mg by mouth daily.) 180 capsule 3   rosuvastatin (CRESTOR) 10 MG tablet Take 10 mg by mouth at bedtime.     aspirin EC 81 MG tablet Take 81 mg by mouth daily. (Patient not taking: Reported on 04/10/2022)     No facility-administered medications prior to visit.    PAST MEDICAL HISTORY: Past Medical History:  Diagnosis Date   GERD  (gastroesophageal reflux disease)    Gout    Hyperlipidemia    Hypertension    Morbid obesity ([place])    Ulnar neuropathy of left upper extremity     PAST SURGICAL HISTORY: History reviewed. No pertinent surgical history.  FAMILY HISTORY: Family History  Problem Relation Age of Onset   Hypertension Mother    Hypertension Father    Atrial fibrillation Father    Diabetes Father     SOCIAL HISTORY: Social History   Socioeconomic History   Marital status: Married    Spouse name: Lorriane Shire   Number of children: 2   Years of education: Not on file   Highest education level: Not on file  Occupational History   Not on file  Tobacco Use   Smoking status: Former    Packs/day: 1.00    Years: 5.00    Total pack years: 5.00    Types: Cigarettes    Quit date: 02/22/2009    Years since quitting: 13.1   Smokeless tobacco: Current    Types: Snuff  Substance and Sexual Activity   Alcohol use: Yes    Comment: rarely   Drug use: Never   Sexual activity: Not on file  Other Topics Concern   Not on file  Social History Narrative   Lives with wife, mom-in- law lives w/them and 2 children   Social  Determinants of Health   Financial Resource Strain: Not on file  Food Insecurity: Not on file  Transportation Needs: Not on file  Physical Activity: Not on file  Stress: Not on file  Social Connections: Not on file  Intimate Partner Violence: Not on file     PHYSICAL EXAM  GENERAL EXAM/CONSTITUTIONAL: Vitals:  Vitals:   04/10/22 1003  BP: 136/88  Pulse: 72  Weight: 298 lb (135.2 kg)  Height: 5\' 10"  (1.778 m)   Body mass index is 42.76 kg/m. Wt Readings from Last 3 Encounters:  04/10/22 298 lb (135.2 kg)  03/25/22 290 lb (131.5 kg)  02/17/19 270 lb (122.5 kg)   Patient is in no distress; well developed, nourished and groomed; neck is supple  CARDIOVASCULAR: Examination of carotid arteries is normal; no carotid bruits Regular rate and rhythm, no murmurs Examination of  peripheral vascular system by observation and palpation is normal  EYES: Ophthalmoscopic exam of optic discs and posterior segments is normal; no papilledema or hemorrhages No results found.  MUSCULOSKELETAL: Gait, strength, tone, movements noted in Neurologic exam below  NEUROLOGIC: MENTAL STATUS:      No data to display         awake, alert, oriented to person, place and time recent and remote memory intact normal attention and concentration language fluent, comprehension intact, naming intact fund of knowledge appropriate  CRANIAL NERVE:  2nd - no papilledema on fundoscopic exam 2nd, 3rd, 4th, 6th - pupils equal and reactive to light, visual fields full to confrontation, extraocular muscles intact, no nystagmus 5th - facial sensation symmetric 7th - facial strength symmetric 8th - hearing intact 9th - palate elevates symmetrically, uvula midline 11th - shoulder shrug symmetric 12th - tongue protrusion midline  MOTOR:  normal bulk and tone, full strength in the BUE, BLE  SENSORY:  normal and symmetric to light touch, pinprick, temperature, vibration  COORDINATION:  finger-nose-finger, fine finger movements normal  REFLEXES:  deep tendon reflexes present and symmetric  GAIT/STATION:  narrow based gait; able to walk on toes, heels and tandem; romberg is negative     DIAGNOSTIC DATA (LABS, IMAGING, TESTING) - I reviewed patient records, labs, notes, testing and imaging myself where available.  Lab Results  Component Value Date   WBC 10.2 03/25/2022   HGB 15.5 03/25/2022   HCT 45.0 03/25/2022   MCV 84.3 03/25/2022   PLT 212 03/25/2022      Component Value Date/Time   NA 140 03/25/2022 1457   K 3.7 03/25/2022 1457   CL 103 03/25/2022 1457   CO2 28 03/25/2022 1457   GLUCOSE 88 03/25/2022 1457   BUN 15 03/25/2022 1457   CREATININE 0.92 03/25/2022 1457   CALCIUM 9.4 03/25/2022 1457   PROT 7.1 12/28/2018 2007   ALBUMIN 4.1 12/28/2018 2007   AST 28  12/28/2018 2007   ALT 40 12/28/2018 2007   ALKPHOS 63 12/28/2018 2007   BILITOT 0.7 12/28/2018 2007   GFRNONAA >60 03/25/2022 1457   GFRAA >60 12/28/2018 2007   No results found for: "CHOL", "HDL", "LDLCALC", "LDLDIRECT", "TRIG", "CHOLHDL" No results found for: "HGBA1C" No results found for: "VITAMINB12" No results found for: "TSH"   03/25/22 CT head  - Negative unenhanced CT scan of the brain.    ASSESSMENT AND PLAN  41 y.o. year old male here with:   Dx:  1. Left-sided headache   2. Left facial numbness     PLAN:  HEADACHES / NUMBNESS (? Migraine variant vs TIA vs other  secondary cause) - MRI brain, carotid u/s, echocardiogram - refer for sleep study - ibuprofen / tylenol as needed - may consider migraine treatments  Orders Placed This Encounter  Procedures   MR BRAIN W WO CONTRAST   Ambulatory referral to Sleep Studies   ECHOCARDIOGRAM COMPLETE BUBBLE STUDY   VAS US CAROTID   Return for pending if symptoms worsen or fail to improve, pending test results.    Penni Bombard, MD [id number], [id number] AM Certified in Neurology, Neurophysiology and Neuroimaging  [facility] [address] [place], [place] [address] [phone]

## 2022-04-10 NOTE — Patient Instructions (Signed)
-   MRI brain, carotid u/s, echocardiogram - refer for sleep study - ibuprofen / tylenol as needed - may consider migraine treatments

## 2022-04-11 ENCOUNTER — Encounter: Payer: Self-pay | Admitting: Diagnostic Neuroimaging

## 2022-04-18 ENCOUNTER — Ambulatory Visit (HOSPITAL_BASED_OUTPATIENT_CLINIC_OR_DEPARTMENT_OTHER)
Admission: RE | Admit: 2022-04-18 | Discharge: 2022-04-18 | Disposition: A | Discharge status: Home / Self Care | Payer: Commercial Managed Care - PPO | Source: Ambulatory Visit | Attending: Diagnostic Neuroimaging | Admitting: Diagnostic Neuroimaging

## 2022-04-18 ENCOUNTER — Ambulatory Visit (HOSPITAL_COMMUNITY)
Admission: RE | Admit: 2022-04-18 | Discharge: 2022-04-18 | Disposition: A | Discharge status: Home / Self Care | Payer: Commercial Managed Care - PPO | Source: Ambulatory Visit | Attending: Diagnostic Neuroimaging | Admitting: Diagnostic Neuroimaging

## 2022-04-18 ENCOUNTER — Telehealth: Payer: Self-pay | Admitting: Diagnostic Neuroimaging

## 2022-04-18 DIAGNOSIS — R200 Anesthesia of skin: Secondary | ICD-10-CM

## 2022-04-18 DIAGNOSIS — R519 Headache, unspecified: Secondary | ICD-10-CM

## 2022-04-18 DIAGNOSIS — I10 Essential (primary) hypertension: Secondary | ICD-10-CM | POA: Diagnosis not present

## 2022-04-18 DIAGNOSIS — E785 Hyperlipidemia, unspecified: Secondary | ICD-10-CM | POA: Insufficient documentation

## 2022-04-18 DIAGNOSIS — G459 Transient cerebral ischemic attack, unspecified: Secondary | ICD-10-CM | POA: Insufficient documentation

## 2022-04-18 LAB — ECHOCARDIOGRAM COMPLETE BUBBLE STUDY
Area-P 1/2: 3.27 cm2
S' Lateral: 2.9 cm

## 2022-04-18 NOTE — Telephone Encounter (Signed)
45 mins MR brain w/wo contrast Dr. Donnal Debar Berkley Harvey: [id number] exp. 04/18/22-07/16/22 scheduled at [facility] 04/25/22 at 7:30am

## 2022-04-18 NOTE — Progress Notes (Signed)
  Echocardiogram 2D Echocardiogram has been performed.  Anthony Liu 04/18/2022, 1:56 PM

## 2022-04-20 ENCOUNTER — Telehealth: Payer: Self-pay

## 2022-04-25 ENCOUNTER — Ambulatory Visit: Payer: Commercial Managed Care - PPO

## 2022-04-25 DIAGNOSIS — R519 Headache, unspecified: Secondary | ICD-10-CM | POA: Diagnosis not present

## 2022-04-25 DIAGNOSIS — R200 Anesthesia of skin: Secondary | ICD-10-CM

## 2022-04-25 MED ORDER — GADOBENATE DIMEGLUMINE 529 MG/ML IV SOLN
20.0000 mL | Freq: Once | INTRAVENOUS | Status: AC | PRN
  Administered 2022-04-25: 20 mL via INTRAVENOUS

## 2022-04-26 ENCOUNTER — Telehealth: Payer: Self-pay

## 2022-04-26 NOTE — Telephone Encounter (Signed)
Contacted pt, LVM per DPR, informing him of results. Number provided to call back with questions.

## 2022-04-26 NOTE — Telephone Encounter (Signed)
-----   Message from Suanne Marker, MD sent at 04/25/2022  8:56 PM EDT ----- Unremarkable imaging results. Please call patient. Continue current plan. -VRP
